# Patient Record
Sex: Female | Born: 1995 | Race: White | Hispanic: No | Marital: Single | Smoking: Former smoker
Health system: Southern US, Community
[De-identification: ages and names within clinical notes are randomized; demographics above are authoritative.]

## PROBLEM LIST (undated history)

## (undated) ENCOUNTER — Inpatient Hospital Stay (HOSPITAL_COMMUNITY): Payer: Self-pay

## (undated) DIAGNOSIS — Z789 Other specified health status: Secondary | ICD-10-CM

## (undated) HISTORY — PX: NO PAST SURGERIES: SHX2092

## (undated) HISTORY — PX: EYE SURGERY: SHX253

## (undated) HISTORY — PX: TONSILLECTOMY: SUR1361

---
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
## 2014-03-10 ENCOUNTER — Telehealth: Payer: Self-pay | Admitting: Pediatrics

## 2014-03-10 DIAGNOSIS — Z2089 Contact with and (suspected) exposure to other communicable diseases: Secondary | ICD-10-CM

## 2014-03-10 DIAGNOSIS — Z207 Contact with and (suspected) exposure to pediculosis, acariasis and other infestations: Secondary | ICD-10-CM

## 2014-03-10 MED ORDER — PERMETHRIN 5 % EX CREA: 1.0000 "application " | TOPICAL_CREAM | Freq: Once | CUTANEOUS | Status: DC

## 2014-03-10 NOTE — Telephone Encounter (Signed)
Household contact was seen in clinic today with scabies.  Entire household is being treated.

## 2014-10-15 ENCOUNTER — Encounter: Payer: Self-pay | Admitting: Urgent Care

## 2014-10-15 DIAGNOSIS — Z3202 Encounter for pregnancy test, result negative: Secondary | ICD-10-CM | POA: Insufficient documentation

## 2014-10-15 DIAGNOSIS — R630 Anorexia: Secondary | ICD-10-CM | POA: Insufficient documentation

## 2014-10-15 DIAGNOSIS — N939 Abnormal uterine and vaginal bleeding, unspecified: Secondary | ICD-10-CM | POA: Diagnosis not present

## 2014-10-15 DIAGNOSIS — N898 Other specified noninflammatory disorders of vagina: Secondary | ICD-10-CM | POA: Diagnosis present

## 2014-10-15 DIAGNOSIS — Z79899 Other long term (current) drug therapy: Secondary | ICD-10-CM | POA: Insufficient documentation

## 2014-10-15 DIAGNOSIS — Z791 Long term (current) use of non-steroidal anti-inflammatories (NSAID): Secondary | ICD-10-CM | POA: Insufficient documentation

## 2014-10-15 DIAGNOSIS — Z87891 Personal history of nicotine dependence: Secondary | ICD-10-CM | POA: Insufficient documentation

## 2014-10-15 DIAGNOSIS — R112 Nausea with vomiting, unspecified: Secondary | ICD-10-CM | POA: Insufficient documentation

## 2014-10-15 LAB — URINALYSIS COMPLETE WITH MICROSCOPIC (ARMC ONLY)
Bacteria, UA: NONE SEEN
Bilirubin Urine: NEGATIVE
Glucose, UA: NEGATIVE mg/dL
Ketones, ur: NEGATIVE mg/dL
Nitrite: NEGATIVE
Protein, ur: NEGATIVE mg/dL
Specific Gravity, Urine: 1.021 (ref 1.005–1.030)
pH: 6 (ref 5.0–8.0)

## 2014-10-15 LAB — POCT PREGNANCY, URINE: PREG TEST UR: NEGATIVE

## 2014-10-15 NOTE — ED Notes (Signed)
Patient presents with multiple c/o. Patient with decreased appetite, nausea, and dark vaginal discharge x 2 weeks. Symptoms began when she changed from Depo injections to oral BCPs.

## 2014-10-16 ENCOUNTER — Emergency Department
Admission: EM | Admit: 2014-10-16 | Discharge: 2014-10-16 | Disposition: A | Discharge status: Home / Self Care | Payer: Medicaid Other | Attending: Emergency Medicine | Admitting: Emergency Medicine

## 2014-10-16 DIAGNOSIS — N939 Abnormal uterine and vaginal bleeding, unspecified: Secondary | ICD-10-CM

## 2014-10-16 LAB — WET PREP, GENITAL
CLUE CELLS WET PREP: NONE SEEN
TRICH WET PREP: NONE SEEN
Yeast Wet Prep HPF POC: NONE SEEN

## 2014-10-16 LAB — CHLAMYDIA/NGC RT PCR (ARMC ONLY)
Chlamydia Tr: NOT DETECTED
N gonorrhoeae: NOT DETECTED

## 2014-10-16 NOTE — Discharge Instructions (Signed)
Please seek medical attention for any high fevers, chest pain, shortness of breath, change in behavior, persistent vomiting, bloody stool or any other new or concerning symptoms. ° ° °Abnormal Uterine Bleeding °Abnormal uterine bleeding can affect women at various stages in life, including teenagers, women in their reproductive years, pregnant women, and women who have reached menopause. Several kinds of uterine bleeding are considered abnormal, including: °· Bleeding or spotting between periods.   °· Bleeding after sexual intercourse.   °· Bleeding that is heavier or more than normal.   °· Periods that last longer than usual. °· Bleeding after menopause.   °Many cases of abnormal uterine bleeding are minor and simple to treat, while others are more serious. Any type of abnormal bleeding should be evaluated by your health care provider. Treatment will depend on the cause of the bleeding. °HOME CARE INSTRUCTIONS °Monitor your condition for any changes. The following actions may help to alleviate any discomfort you are experiencing: °· Avoid the use of tampons and douches as directed by your health care provider. °· Change your pads frequently. °You should get regular pelvic exams and Pap tests. Keep all follow-up appointments for diagnostic tests as directed by your health care provider.  °SEEK MEDICAL CARE IF:  °· Your bleeding lasts more than 1 week.   °· You feel dizzy at times.   °SEEK IMMEDIATE MEDICAL CARE IF:  °· You pass out.   °· You are changing pads every 15 to 30 minutes.   °· You have abdominal pain. °· You have a fever.   °· You become sweaty or weak.   °· You are passing large blood clots from the vagina.   °· You start to feel nauseous and vomit. °MAKE SURE YOU:  °· Understand these instructions. °· Will watch your condition. °· Will get help right away if you are not doing well or get worse. °  °This information is not intended to replace advice given to you by your health care provider. Make sure  you discuss any questions you have with your health care provider. °  °Document Released: 12/26/2004 Document Revised: 12/31/2012 Document Reviewed: 07/25/2012 °Elsevier Interactive Patient Education ©2016 [employer] ° °

## 2014-10-16 NOTE — ED Provider Notes (Signed)
[facility] Emergency Department Provider Note    ____________________________________________  Time seen: 0125  I have reviewed the triage vital signs and the nursing notes.   HISTORY  Chief Complaint Vaginal Discharge; Nausea; and Anorexia   History limited by: Not Limited   HPI Kelly Burch is a 19 y.o. female who presents to the emergency department today with primary concern for vaginal bleeding. The patient states that she has had 2 weeks now of abnormal vaginal bleeding. She states it is dark red. She has had someassociated pelvic pain. She describes it as crampy. It has been intermittent. It is located in the midline. She denies any bad odor to the discharge. Denies any vaginal discomfort. She states she has now been on oral birth control pills for the past 2 months. In addition the patient has had some associated nausea and decreased appetite. She denies any chest pain or shortness of breath.     Past Medical History  Diagnosis Date  . Medical history non-contributory     Patient Active Problem List   Diagnosis Date Noted  . Pregnancy 03/05/2013  . Polyhydramnios in third trimester, antepartum complication 03/04/2013  . GBS (group B Streptococcus carrier), +RV culture, currently pregnant 02/12/2013  . Traumatic injury during pregnancy in second trimester 11/27/2012  . Supervision of normal first teen pregnancy in second trimester 11/06/2012    Past Surgical History  Procedure Laterality Date  . No past surgeries    . Tonsillectomy    . Eye surgery      Current Outpatient Rx  Name  Route  Sig  Dispense  Refill  . ibuprofen (ADVIL,MOTRIN) 600 MG tablet   Oral   Take 1 tablet (600 mg total) by mouth every 6 (six) hours.   30 tablet   0   . levonorgestrel (MIRENA) 20 MCG/24HR IUD   Intrauterine   1 Intra Uterine Device (1 each total) by Intrauterine route once.   1 each   0   . norethindrone-ethinyl estradiol  (TRIPHASIL,CYCLAFEM,ALYACEN) 0.5/0.75/1-35 MG-MCG tablet   Oral   Take 1 tablet by mouth daily.   1 Package   0   . permethrin (ACTICIN) 5 % cream   Topical   Apply 1 application topically once.   60 g   0   . Prenatal Vit-Fe Fumarate-FA (PRENATAL MULTIVITAMIN) TABS tablet   Oral   Take 1 tablet by mouth at bedtime.         . sertraline (ZOLOFT) 50 MG tablet   Oral   Take 1 tablet (50 mg total) by mouth daily.   90 tablet   1     Allergies Review of patient's allergies indicates no known allergies.  Family History  Problem Relation Age of Onset  . Alcohol abuse Neg Hx   . Arthritis Neg Hx   . Birth defects Neg Hx   . COPD Neg Hx   . Depression Neg Hx   . Early death Neg Hx   . Hearing loss Neg Hx   . Heart disease Neg Hx   . Hyperlipidemia Neg Hx   . Kidney disease Neg Hx   . Learning disabilities Neg Hx   . Mental illness Neg Hx   . Mental retardation Neg Hx   . Miscarriages / Stillbirths Neg Hx   . Stroke Neg Hx   . Vision loss Neg Hx   . Asthma Mother   . Hypertension Father   . Cancer Maternal Grandfather   . Diabetes  Maternal Grandfather   . Cancer Paternal Grandfather     Social History Social History  Substance Use Topics  . Smoking status: Former Smoker -- 0.50 packs/day for 2 years    Types: Cigarettes  . Smokeless tobacco: Never Used  . Alcohol Use: No    Review of Systems  Constitutional: Negative for fever. Cardiovascular: Negative for chest pain. Respiratory: Negative for shortness of breath. Gastrointestinal: Negative for abdominal pain, vomiting and diarrhea. Genitourinary: Negative for dysuria. Musculoskeletal: Negative for back pain. Skin: Negative for rash. Neurological: Negative for headaches, focal weakness or numbness.   10-point ROS otherwise negative.  ____________________________________________   PHYSICAL EXAM:  VITAL SIGNS: ED Triage Vitals  Enc Vitals Group     BP 10/15/14 2234 131/66 mmHg     Pulse Rate  10/15/14 2234 91     Resp 10/15/14 2234 16     Temp 10/15/14 2234 98.5 F (36.9 C)     Temp Source 10/15/14 2234 Oral     SpO2 10/15/14 2234 100 %     Weight --      Height --      Head Cir --      Peak Flow --      Pain Score 10/15/14 2234 2   Constitutional: Alert and oriented. Well appearing and in no distress. Eyes: Conjunctivae are normal. PERRL. Normal extraocular movements. ENT   Head: Normocephalic and atraumatic.   Nose: No congestion/rhinnorhea.   Mouth/Throat: Mucous membranes are moist.   Neck: No stridor. Hematological/Lymphatic/Immunilogical: No cervical lymphadenopathy. Cardiovascular: Normal rate, regular rhythm.  No murmurs, rubs, or gallops. Respiratory: Normal respiratory effort without tachypnea nor retractions. Breath sounds are clear and equal bilaterally. No wheezes/rales/rhonchi. Gastrointestinal: Soft and nontender. No distention. There is no CVA tenderness. Genitourinary: No external lesions. Small amount of blood in the vaginal vault. No cervical motion tenderness. No adnexal tenderness or fullness. Musculoskeletal: Normal range of motion in all extremities. No joint effusions.  No lower extremity tenderness nor edema. Neurologic:  Normal speech and language. No gross focal neurologic deficits are appreciated. Speech is normal.  Skin:  Skin is warm, dry and intact. No rash noted. Psychiatric: Mood and affect are normal. Speech and behavior are normal. Patient exhibits appropriate insight and judgment.  ____________________________________________    LABS (pertinent positives/negatives)  Labs Reviewed  URINALYSIS COMPLETEWITH MICROSCOPIC ([facility] ONLY) - Abnormal; Notable for the following:    Color, Urine YELLOW (*)    APPearance HAZY (*)    Hgb urine dipstick 3+ (*)    Leukocytes, UA TRACE (*)    Squamous Epithelial / LPF 6-30 (*)    All other components within normal limits  POCT PREGNANCY, URINE      ____________________________________________   EKG  None  ____________________________________________    RADIOLOGY  None  ___________________________________________   PROCEDURES  Procedure(s) performed: None  Critical Care performed: No  ____________________________________________   INITIAL IMPRESSION / ASSESSMENT AND PLAN / ED COURSE  Pertinent labs & imaging results that were available during my care of the patient were reviewed by me and considered in my medical decision making (see chart for details).  Patient presented to the emergency department today with concerns for abnormal vaginal bleeding. At this point I think likely patient's bleeding related to the oral contraceptives. Vital signs stable. Will have patient follow-up with OB/GYN.  ____________________________________________   FINAL CLINICAL IMPRESSION(S) / ED DIAGNOSES  Final diagnoses:  Abnormal uterine bleeding     Phineas Semen, MD 10/16/14 [phone]

## 2014-12-07 ENCOUNTER — Emergency Department
Admission: EM | Admit: 2014-12-07 | Discharge: 2014-12-07 | Disposition: A | Discharge status: Home / Self Care | Payer: BLUE CROSS/BLUE SHIELD | Attending: Emergency Medicine | Admitting: Emergency Medicine

## 2014-12-07 ENCOUNTER — Encounter: Payer: Self-pay | Admitting: Urgent Care

## 2014-12-07 DIAGNOSIS — Z79899 Other long term (current) drug therapy: Secondary | ICD-10-CM | POA: Diagnosis not present

## 2014-12-07 DIAGNOSIS — N898 Other specified noninflammatory disorders of vagina: Secondary | ICD-10-CM | POA: Diagnosis not present

## 2014-12-07 DIAGNOSIS — R1030 Lower abdominal pain, unspecified: Secondary | ICD-10-CM | POA: Diagnosis present

## 2014-12-07 DIAGNOSIS — Z791 Long term (current) use of non-steroidal anti-inflammatories (NSAID): Secondary | ICD-10-CM | POA: Insufficient documentation

## 2014-12-07 DIAGNOSIS — Z87891 Personal history of nicotine dependence: Secondary | ICD-10-CM | POA: Diagnosis not present

## 2014-12-07 DIAGNOSIS — Z3202 Encounter for pregnancy test, result negative: Secondary | ICD-10-CM | POA: Diagnosis not present

## 2014-12-07 LAB — WET PREP, GENITAL
CLUE CELLS WET PREP: NONE SEEN
Sperm: NONE SEEN
Trich, Wet Prep: NONE SEEN
YEAST WET PREP: NONE SEEN

## 2014-12-07 LAB — URINALYSIS COMPLETE WITH MICROSCOPIC (ARMC ONLY)
BACTERIA UA: NONE SEEN
Bilirubin Urine: NEGATIVE
GLUCOSE, UA: NEGATIVE mg/dL
HGB URINE DIPSTICK: NEGATIVE
Ketones, ur: NEGATIVE mg/dL
Leukocytes, UA: NEGATIVE
Nitrite: NEGATIVE
Protein, ur: NEGATIVE mg/dL
Specific Gravity, Urine: 1.027 (ref 1.005–1.030)
pH: 6 (ref 5.0–8.0)

## 2014-12-07 LAB — CHLAMYDIA/NGC RT PCR (ARMC ONLY)
Chlamydia Tr: NOT DETECTED
N GONORRHOEAE: NOT DETECTED

## 2014-12-07 LAB — POCT PREGNANCY, URINE: PREG TEST UR: NEGATIVE

## 2014-12-07 NOTE — ED Notes (Signed)
Pt complains of white discharge with odor for two weeks. Pt states the pain is in lower abdomen.

## 2014-12-07 NOTE — ED Provider Notes (Signed)
[facility] Emergency Department Provider Note   ____________________________________________  Time seen: 2000  I have reviewed the triage vital signs and the nursing notes.   HISTORY  Chief Complaint Abdominal Cramping; Nausea; and Vaginal Discharge   History limited by: Not Limited   HPI Kelly Burch is a 19 y.o. female with no significant past medical history presents to the emergency department today because of concern for a 2 week history of abdominal pain and vaginal discharge. Patient states that her pain will start out in the suprapubic region as a cramping and then migrated down into her vaginal area. She states it then becomes a sharp pain. The patient states that this pain is intermittent. She states during the same 2 weeks. She has had her normal amount of vaginal white discharge however she has now noticed a foul odor to it. She denies similar symptoms in the past. Denies any recent fevers, nausea or vomiting. She denies any recent antibiotic use.     Past Medical History  Diagnosis Date  . Medical history non-contributory     Patient Active Problem List   Diagnosis Date Noted  . Pregnancy 03/05/2013  . Polyhydramnios in third trimester, antepartum complication 03/04/2013  . GBS (group B Streptococcus carrier), +RV culture, currently pregnant 02/12/2013  . Traumatic injury during pregnancy in second trimester 11/27/2012  . Supervision of normal first teen pregnancy in second trimester 11/06/2012    Past Surgical History  Procedure Laterality Date  . No past surgeries    . Tonsillectomy    . Eye surgery      Current Outpatient Rx  Name  Route  Sig  Dispense  Refill  . ibuprofen (ADVIL,MOTRIN) 600 MG tablet   Oral   Take 1 tablet (600 mg total) by mouth every 6 (six) hours.   30 tablet   0   . levonorgestrel (MIRENA) 20 MCG/24HR IUD   Intrauterine   1 Intra Uterine Device (1 each total) by Intrauterine route once.   1  each   0   . norethindrone-ethinyl estradiol (TRIPHASIL,CYCLAFEM,ALYACEN) 0.5/0.75/1-35 MG-MCG tablet   Oral   Take 1 tablet by mouth daily.   1 Package   0   . permethrin (ACTICIN) 5 % cream   Topical   Apply 1 application topically once.   60 g   0   . Prenatal Vit-Fe Fumarate-FA (PRENATAL MULTIVITAMIN) TABS tablet   Oral   Take 1 tablet by mouth at bedtime.         . sertraline (ZOLOFT) 50 MG tablet   Oral   Take 1 tablet (50 mg total) by mouth daily.   90 tablet   1     Allergies Review of patient's allergies indicates no known allergies.  Family History  Problem Relation Age of Onset  . Alcohol abuse Neg Hx   . Arthritis Neg Hx   . Birth defects Neg Hx   . COPD Neg Hx   . Depression Neg Hx   . Early death Neg Hx   . Hearing loss Neg Hx   . Heart disease Neg Hx   . Hyperlipidemia Neg Hx   . Kidney disease Neg Hx   . Learning disabilities Neg Hx   . Mental illness Neg Hx   . Mental retardation Neg Hx   . Miscarriages / Stillbirths Neg Hx   . Stroke Neg Hx   . Vision loss Neg Hx   . Asthma Mother   . Hypertension Father   .  Cancer Maternal Grandfather   . Diabetes Maternal Grandfather   . Cancer Paternal Grandfather     Social History Social History  Substance Use Topics  . Smoking status: Former Smoker -- 0.50 packs/day for 2 years    Types: Cigarettes  . Smokeless tobacco: Never Used  . Alcohol Use: No    Review of Systems  Constitutional: Negative for fever. Cardiovascular: Negative for chest pain. Respiratory: Negative for shortness of breath. Gastrointestinal: Positive for suprapubic abdominal pain Genitourinary: Negative for dysuria. Musculoskeletal: Negative for back pain. Skin: Negative for rash. Neurological: Negative for headaches, focal weakness or numbness.  10-point ROS otherwise negative.  ____________________________________________   PHYSICAL EXAM:  VITAL SIGNS: ED Triage Vitals  Enc Vitals Group     BP 12/07/14  1936 126/76 mmHg     Pulse Rate 12/07/14 1936 95     Resp 12/07/14 1936 14     Temp 12/07/14 1936 98.4 F (36.9 C)     Temp Source 12/07/14 1936 Oral     SpO2 12/07/14 1936 100 %     Weight 12/07/14 1936 135 lb (61.236 kg)     Height 12/07/14 1936 5\' 3"  (1.6 m)     Head Cir --      Peak Flow --      Pain Score 12/07/14 1936 4   Constitutional: Alert and oriented. Well appearing and in no distress. Eyes: Conjunctivae are normal. PERRL. Normal extraocular movements. ENT   Head: Normocephalic and atraumatic.   Nose: No congestion/rhinnorhea.   Mouth/Throat: Mucous membranes are moist.   Neck: No stridor. Hematological/Lymphatic/Immunilogical: No cervical lymphadenopathy. Cardiovascular: Normal rate, regular rhythm.  No murmurs, rubs, or gallops. Respiratory: Normal respiratory effort without tachypnea nor retractions. Breath sounds are clear and equal bilaterally. No wheezes/rales/rhonchi. Gastrointestinal: Soft and nontender. No distention. There is no CVA tenderness. Genitourinary: No external lesions. No blood in vaginal vault. Small amount of vaginal discharge. No CMT. No adnexal fullness or tenderness.  Musculoskeletal: Normal range of motion in all extremities. No joint effusions.  No lower extremity tenderness nor edema. Neurologic:  Normal speech and language. No gross focal neurologic deficits are appreciated.  Skin:  Skin is warm, dry and intact. No rash noted. Psychiatric: Mood and affect are normal. Speech and behavior are normal. Patient exhibits appropriate insight and judgment.  ____________________________________________    LABS (pertinent positives/negatives)  Labs Reviewed  WET PREP, GENITAL - Abnormal; Notable for the following:    WBC, Wet Prep HPF POC FEW (*)    All other components within normal limits  URINALYSIS COMPLETEWITH MICROSCOPIC ([facility] ONLY) - Abnormal; Notable for the following:    Color, Urine YELLOW (*)    APPearance HAZY (*)     Squamous Epithelial / LPF 6-30 (*)    All other components within normal limits  CHLAMYDIA/NGC RT PCR ([facility] ONLY)  POCT PREGNANCY, URINE     ____________________________________________   EKG  None  ____________________________________________    RADIOLOGY  None   ____________________________________________   PROCEDURES  Procedure(s) performed: None  Critical Care performed: No  ____________________________________________   INITIAL IMPRESSION / ASSESSMENT AND PLAN / ED COURSE  Pertinent labs & imaging results that were available during my care of the patient were reviewed by me and considered in my medical decision making (see chart for details).  Patient presented to the emergency department today with concerns for change in vaginal discharge. As well as suprapubic pain. Wet prep and urine without concerning findings. This point unclear etiology of the  vaginitis. Encourage patient to have primary care follow-up.  ____________________________________________   FINAL CLINICAL IMPRESSION(S) / ED DIAGNOSES  Final diagnoses:  Vaginal discharge     Phineas Semen, MD 12/07/14 2041

## 2014-12-07 NOTE — ED Notes (Signed)
Patient presents with c/o suprapubic pain that "radiates down into vagina" and (+) nausea x 2 weeks. Denies dysuria. Patient reports a "thick white" malodorous discharge. Patient reports that she works 8-5 and has been unable to get to PCP. [facility], however they refused her insurance.

## 2014-12-07 NOTE — Discharge Instructions (Signed)
Please seek medical attention for any high fevers, chest pain, shortness of breath, change in behavior, persistent vomiting, bloody stool or any other new or concerning symptoms.  Vaginitis Vaginitis is an inflammation of the vagina. It is most often caused by a change in the normal balance of the bacteria and yeast that live in the vagina. This change in balance causes an overgrowth of certain bacteria or yeast, which causes the inflammation. There are different types of vaginitis, but the most common types are:  Bacterial vaginosis.  Yeast infection (candidiasis).  Trichomoniasis vaginitis. This is a sexually transmitted infection (STI).  Viral vaginitis.  Atrophic vaginitis.  Allergic vaginitis. CAUSES  The cause depends on the type of vaginitis. Vaginitis can be caused by:  Bacteria (bacterial vaginosis).  Yeast (yeast infection).  A parasite (trichomoniasis vaginitis)  A virus (viral vaginitis).  Low hormone levels (atrophic vaginitis). Low hormone levels can occur during pregnancy, breastfeeding, or after menopause.  Irritants, such as bubble baths, scented tampons, and feminine sprays (allergic vaginitis). Other factors can change the normal balance of the yeast and bacteria that live in the vagina. These include:  Antibiotic medicines.  Poor hygiene.  Diaphragms, vaginal sponges, spermicides, birth control pills, and intrauterine devices (IUD).  Sexual intercourse.  Infection.  Uncontrolled diabetes.  A weakened immune system. SYMPTOMS  Symptoms can vary depending on the cause of the vaginitis. Common symptoms include:  Abnormal vaginal discharge.  The discharge is white, gray, or yellow with bacterial vaginosis.  The discharge is thick, white, and cheesy with a yeast infection.  The discharge is frothy and yellow or greenish with trichomoniasis.  A bad vaginal odor.  The odor is fishy with bacterial vaginosis.  Vaginal itching, pain, or  swelling.  Painful intercourse.  Pain or burning when urinating. Sometimes, there are no symptoms. TREATMENT  Treatment will vary depending on the type of infection.   Bacterial vaginosis and trichomoniasis are often treated with antibiotic creams or pills.  Yeast infections are often treated with antifungal medicines, such as vaginal creams or suppositories.  Viral vaginitis has no cure, but symptoms can be treated with medicines that relieve discomfort. Your sexual partner should be treated as well.  Atrophic vaginitis may be treated with an estrogen cream, pill, suppository, or vaginal ring. If vaginal dryness occurs, lubricants and moisturizing creams may help. You may be told to avoid scented soaps, sprays, or douches.  Allergic vaginitis treatment involves quitting the use of the product that is causing the problem. Vaginal creams can be used to treat the symptoms. HOME CARE INSTRUCTIONS   Take all medicines as directed by your caregiver.  Keep your genital area clean and dry. Avoid soap and only rinse the area with water.  Avoid douching. It can remove the healthy bacteria in the vagina.  Do not use tampons or have sexual intercourse until your vaginitis has been treated. Use sanitary pads while you have vaginitis.  Wipe from front to back. This avoids the spread of bacteria from the rectum to the vagina.  Let air reach your genital area.  Wear cotton underwear to decrease moisture buildup.  Avoid wearing underwear while you sleep until your vaginitis is gone.  Avoid tight pants and underwear or nylons without a cotton panel.  Take off wet clothing (especially bathing suits) as soon as possible.  Use mild, non-scented products. Avoid using irritants, such as:  Scented feminine sprays.  Fabric softeners.  Scented detergents.  Scented tampons.  Scented soaps or bubble baths.  Practice safe sex and use condoms. Condoms may prevent the spread of trichomoniasis  and viral vaginitis. SEEK MEDICAL CARE IF:   You have abdominal pain.  You have a fever or persistent symptoms for more than 2-3 days.  You have a fever and your symptoms suddenly get worse.   This information is not intended to replace advice given to you by your health care provider. Make sure you discuss any questions you have with your health care provider.   Document Released: 10/23/2006 Document Revised: 05/12/2014 Document Reviewed: 06/08/2011 Elsevier Interactive Patient Education [employer]

## 2015-03-20 IMAGING — US US OB FOLLOW-UP
2 series · 12 of 28 positions shown · non-contrast
Comparison: none

[Series 1: us ob follow up · 11 of 67 slices shown (1 of 2)]
[im 3/67]
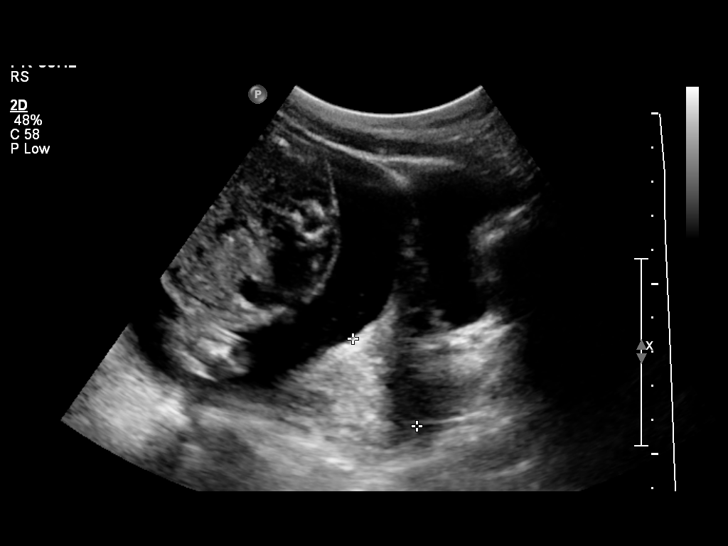
[im 9/67]
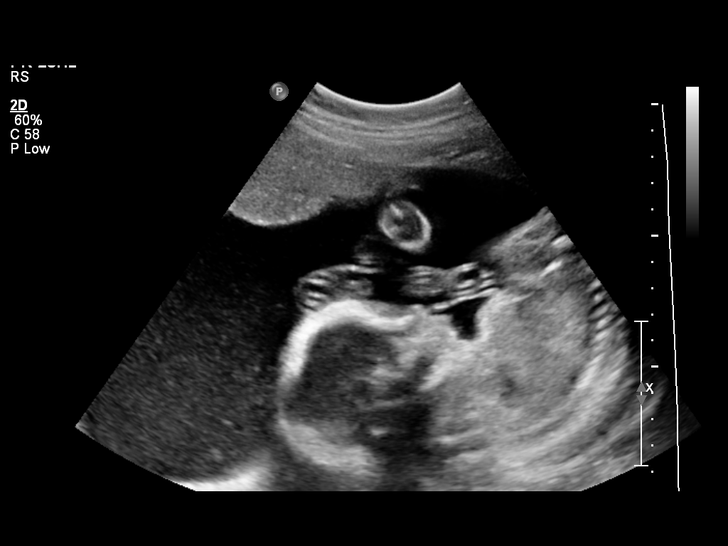
[im 14/67]
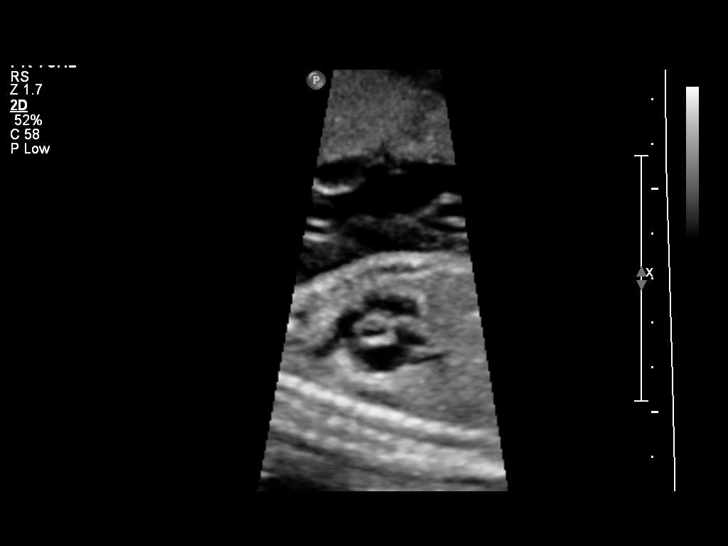
[im 23/67]
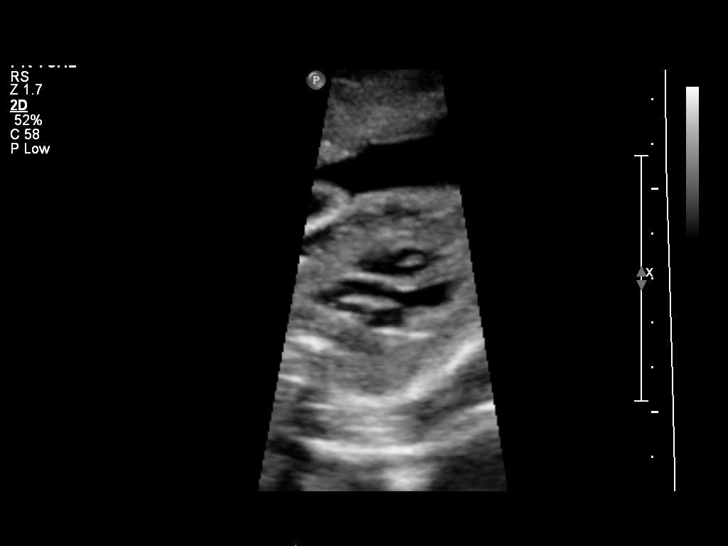
[im 28/67]
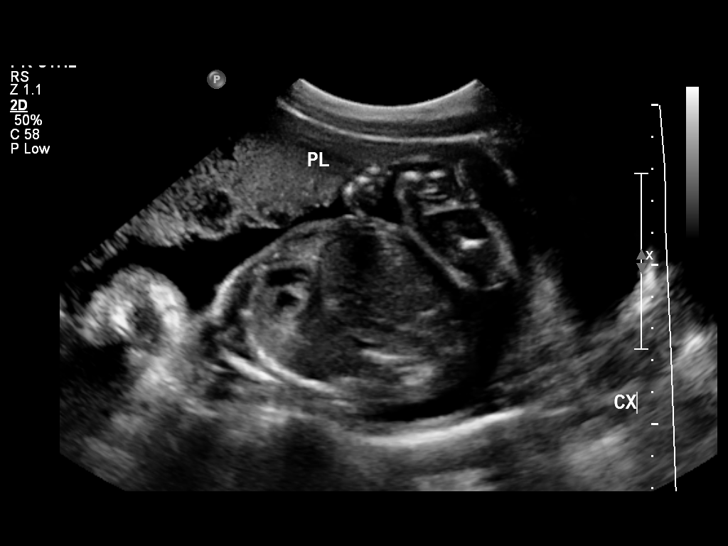
[im 34/67]
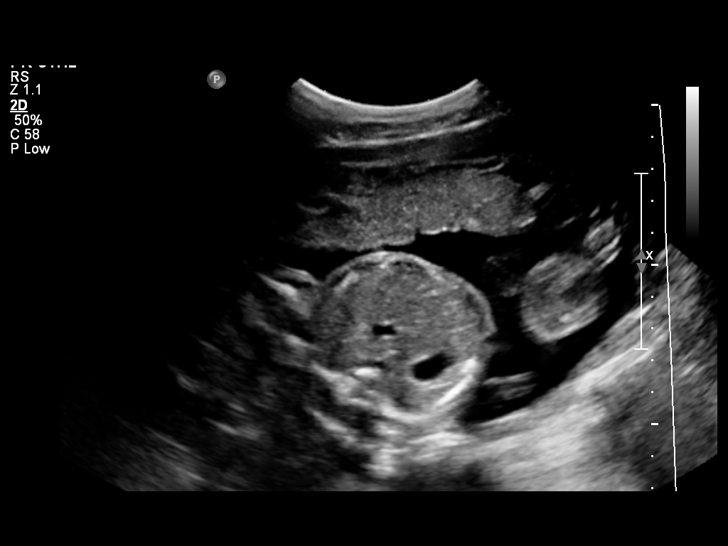
[im 42/67]
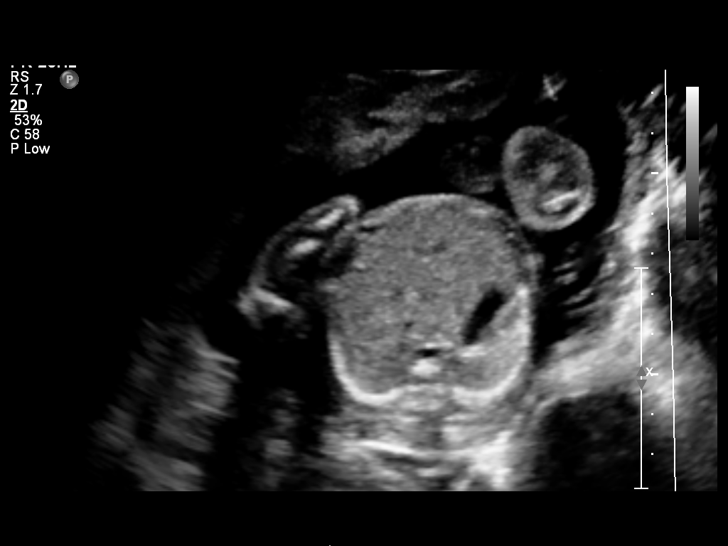
[im 47/67]
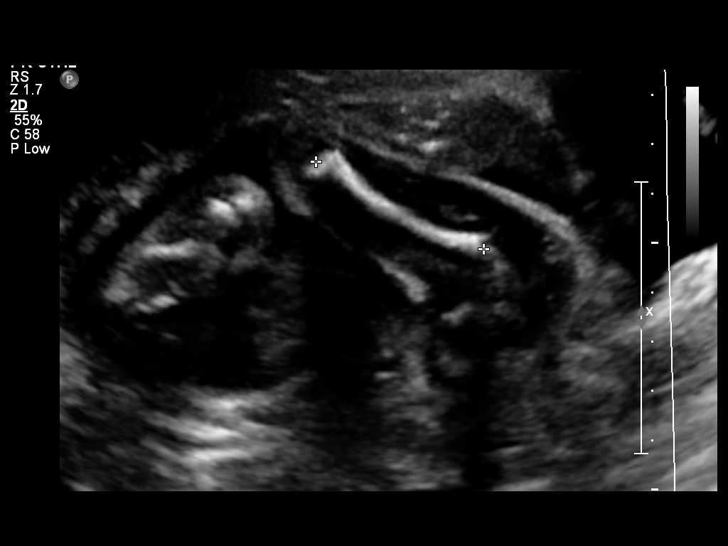
[im 53/67]
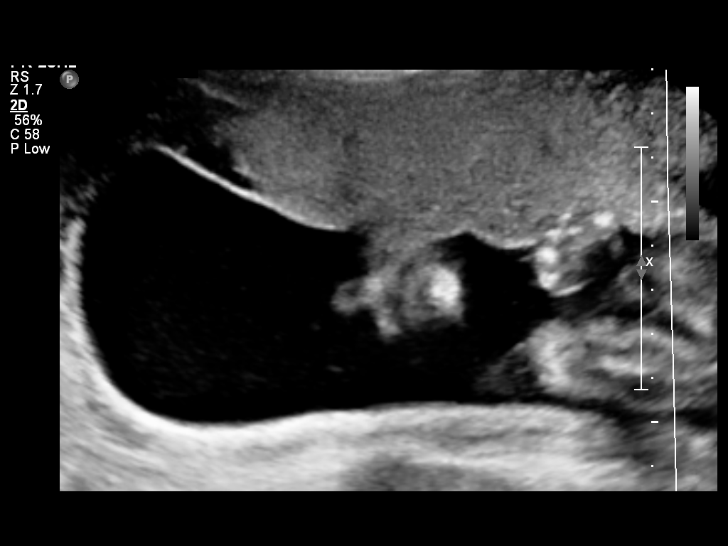
[im 61/67]
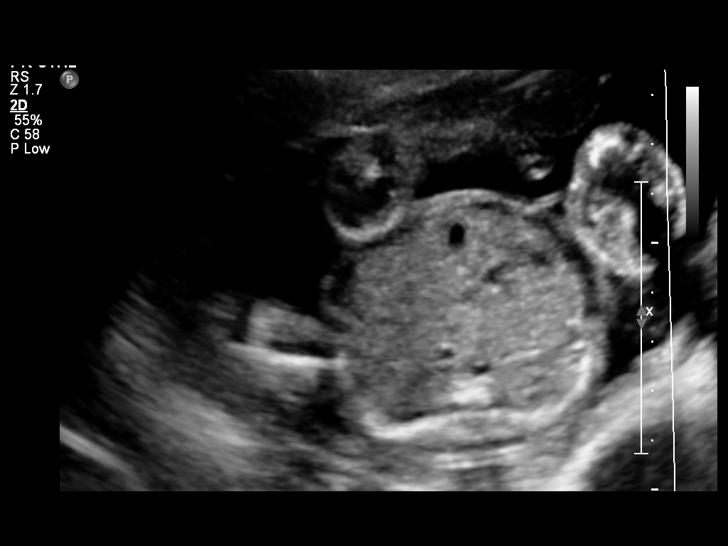
[im 67/67]
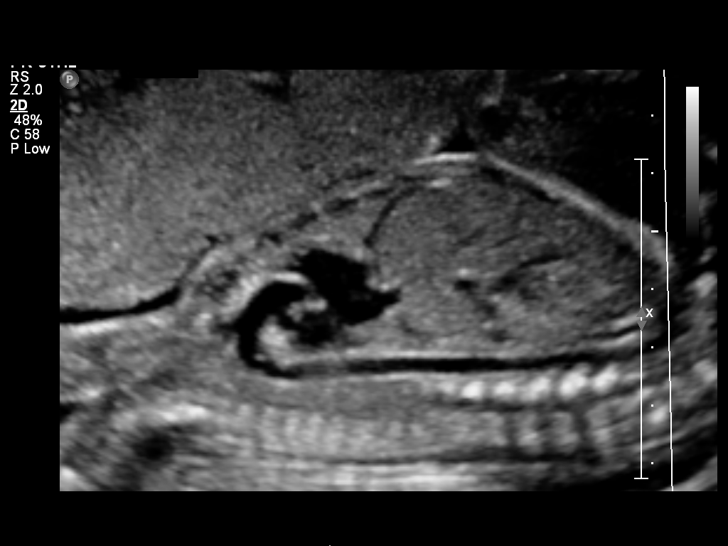

[Series 1: us ob follow up · 1 of 7 slices shown (2 of 2)]
[im 4/7]
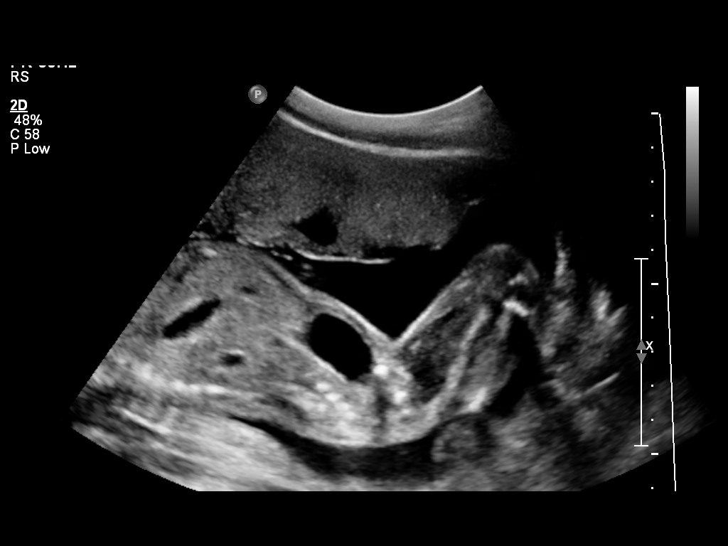

[12 of 28 positions shown; findings below may reference images not displayed]

OBSTETRICS REPORT
                      (Signed Final 11/06/2012 [DATE])

Service(s) Provided

 US OB FOLLOW UP                                       76816.1
Indications

 Follow-up incomplete fetal anatomic evaluation
 No or Little Prenatal Care
Fetal Evaluation

 Num Of Fetuses:    1
 Fetal Heart Rate:  134                          bpm
 Cardiac Activity:  Observed
 Presentation:      Transverse, head to
                    maternal right
 Placenta:          Anterior, above cervical os
 P. Cord            Visualized, central
 Insertion:

 Amniotic Fluid
 AFI FV:      Subjectively within normal limits
                                             Larg Pckt:    5.68  cm
Biometry

 BPD:     55.4  mm     G. Age:  22w 6d                CI:        71.87   70 - 86
                                                      FL/HC:      18.5   18.7 -

 HC:       208  mm     G. Age:  22w 6d       10  %    HC/AC:      1.16   1.05 -

 AC:     179.1  mm     G. Age:  22w 6d       17  %    FL/BPD:     69.3   71 - 87
 FL:      38.4  mm     G. Age:  22w 2d        7  %    FL/AC:      21.4   20 - 24

 Est. FW:     519  gm      1 lb 2 oz     27  %
Gestational Age

 Clinical EDD:  23w 5d                                        EDD:   02/28/13
 U/S Today:     22w 5d                                        EDD:   03/07/13
 Best:          23w 5d     Det. By:  Clinical EDD             EDD:   02/28/13
Anatomy

 Cranium:          Appears normal         Aortic Arch:      Appears normal
 Fetal Cavum:      Appears normal         Ductal Arch:      Appears normal
 Ventricles:       Appears normal         Diaphragm:        Appears normal
 Choroid Plexus:   Previously seen        Stomach:          Appears normal, left
                                                            sided
 Cerebellum:       Previously seen        Abdomen:          Appears normal
 Posterior Fossa:  Previously seen        Abdominal Wall:   Appears nml (cord
                                                            insert, abd wall)
 Nuchal Fold:      Previously seen        Cord Vessels:     Appears normal (3
                                                            vessel cord)
 Face:             Appears normal         Kidneys:          Appear normal
                   (orbits and profile)
 Lips:             Appears normal         Bladder:          Appears normal
 Heart:            Appears normal         Spine:            Not well visualized
                   (4CH, axis, and
                   situs)
 RVOT:             Appears normal         Lower             Appears normal
                                          Extremities:
 LVOT:             Previously seen        Upper             Appears normal
                                          Extremities:

 Other:  Fetus appears to be a male. Technically difficult due to fetal position.
         Heels visualized.
Cervix Uterus Adnexa

 Cervical Length:    3.2      cm

 Cervix:       Normal appearance by transabdominal scan.
 Uterus:       No abnormality visualized.
 Cul De Sac:   No free fluid seen.

 Left Ovary:    Not visualized.
 Right Ovary:   Not visualized.
 Adnexa:     No abnormality visualized.
Impression

 Single IUP at 23 [DATE] weeks
 Normal interval anatomy; the anatomic fetal survey is now
 complete
 Interval growth is appropriate (22nd %tile)
 Normal amniotic fluid volume
Recommendations

 Follow-up ultrasounds as clinically indicated.

 questions or concerns.

## 2015-04-06 ENCOUNTER — Emergency Department
Admission: EM | Admit: 2015-04-06 | Discharge: 2015-04-06 | Disposition: A | Discharge status: Home / Self Care | Payer: BLUE CROSS/BLUE SHIELD | Attending: Emergency Medicine | Admitting: Emergency Medicine

## 2015-04-06 ENCOUNTER — Encounter: Payer: Self-pay | Admitting: Medical Oncology

## 2015-04-06 DIAGNOSIS — Z79899 Other long term (current) drug therapy: Secondary | ICD-10-CM | POA: Diagnosis not present

## 2015-04-06 DIAGNOSIS — J069 Acute upper respiratory infection, unspecified: Secondary | ICD-10-CM | POA: Insufficient documentation

## 2015-04-06 DIAGNOSIS — Z793 Long term (current) use of hormonal contraceptives: Secondary | ICD-10-CM | POA: Diagnosis not present

## 2015-04-06 DIAGNOSIS — J029 Acute pharyngitis, unspecified: Secondary | ICD-10-CM | POA: Diagnosis present

## 2015-04-06 MED ORDER — GUAIFENESIN-CODEINE 100-10 MG/5ML PO SOLN: 10.0000 mL | ORAL | Status: DC | PRN

## 2015-04-06 MED ORDER — IBUPROFEN 800 MG PO TABS: 800.0000 mg | ORAL_TABLET | Freq: Three times a day (TID) | ORAL | Status: DC | PRN

## 2015-04-06 MED ORDER — CHLORPHENIRAMINE MALEATE 4 MG PO TABS: 4.0000 mg | ORAL_TABLET | Freq: Two times a day (BID) | ORAL | Status: DC | PRN

## 2015-04-06 NOTE — Discharge Instructions (Signed)
Upper Respiratory Infection, Adult Most upper respiratory infections (URIs) are a viral infection of the air passages leading to the lungs. A URI affects the nose, throat, and upper air passages. The most common type of URI is nasopharyngitis and is typically referred to as "the common cold." URIs run their course and usually go away on their own. Most of the time, a URI does not require medical attention, but sometimes a bacterial infection in the upper airways can follow a viral infection. This is called a secondary infection. Sinus and middle ear infections are common types of secondary upper respiratory infections. Bacterial pneumonia can also complicate a URI. A URI can worsen asthma and chronic obstructive pulmonary disease (COPD). Sometimes, these complications can require emergency medical care and may be life threatening.  CAUSES Almost all URIs are caused by viruses. A virus is a type of germ and can spread from one person to another.  RISKS FACTORS You may be at risk for a URI if:   You smoke.   You have chronic heart or lung disease.  You have a weakened defense (immune) system.   You are very young or very old.   You have nasal allergies or asthma.  You work in crowded or poorly ventilated areas.  You work in health care facilities or schools. SIGNS AND SYMPTOMS  Symptoms typically develop 2-3 days after you come in contact with a cold virus. Most viral URIs last 7-10 days. However, viral URIs from the influenza virus (flu virus) can last 14-18 days and are typically more severe. Symptoms may include:   Runny or stuffy (congested) nose.   Sneezing.   Cough.   Sore throat.   Headache.   Fatigue.   Fever.   Loss of appetite.   Pain in your forehead, behind your eyes, and over your cheekbones (sinus pain).  Muscle aches.  DIAGNOSIS  Your health care provider may diagnose a URI by:  Physical exam.  Tests to check that your symptoms are not due to  another condition such as:  Strep throat.  Sinusitis.  Pneumonia.  Asthma. TREATMENT  A URI goes away on its own with time. It cannot be cured with medicines, but medicines may be prescribed or recommended to relieve symptoms. Medicines may help:  Reduce your fever.  Reduce your cough.  Relieve nasal congestion. HOME CARE INSTRUCTIONS   Take medicines only as directed by your health care provider.   Gargle warm saltwater or take cough drops to comfort your throat as directed by your health care provider.  Use a warm mist humidifier or inhale steam from a shower to increase air moisture. This may make it easier to breathe.  Drink enough fluid to keep your urine clear or pale yellow.   Eat soups and other clear broths and maintain good nutrition.   Rest as needed.   Return to work when your temperature has returned to normal or as your health care provider advises. You may need to stay home longer to avoid infecting others. You can also use a face mask and careful hand washing to prevent spread of the virus.  Increase the usage of your inhaler if you have asthma.   Do not use any tobacco products, including cigarettes, chewing tobacco, or electronic cigarettes. If you need help quitting, ask your health care provider. PREVENTION  The best way to protect yourself from getting a cold is to practice good hygiene.   Avoid oral or hand contact with people with cold   symptoms.   Wash your hands often if contact occurs.  There is no clear evidence that vitamin C, vitamin E, echinacea, or exercise reduces the chance of developing a cold. However, it is always recommended to get plenty of rest, exercise, and practice good nutrition.  SEEK MEDICAL CARE IF:   You are getting worse rather than better.   Your symptoms are not controlled by medicine.   You have chills.  You have worsening shortness of breath.  You have brown or red mucus.  You have yellow or brown nasal  discharge.  You have pain in your face, especially when you bend forward.  You have a fever.  You have swollen neck glands.  You have pain while swallowing.  You have white areas in the back of your throat. SEEK IMMEDIATE MEDICAL CARE IF:   You have severe or persistent:  Headache.  Ear pain.  Sinus pain.  Chest pain.  You have chronic lung disease and any of the following:  Wheezing.  Prolonged cough.  Coughing up blood.  A change in your usual mucus.  You have a stiff neck.  You have changes in your:  Vision.  Hearing.  Thinking.  Mood. MAKE SURE YOU:   Understand these instructions.  Will watch your condition.  Will get help right away if you are not doing well or get worse.   This information is not intended to replace advice given to you by your health care provider. Make sure you discuss any questions you have with your health care provider.   Document Released: 06/21/2000 Document Revised: 05/12/2014 Document Reviewed: 04/02/2013 Elsevier Interactive Patient Education 2016 [employer]  

## 2015-04-06 NOTE — ED Notes (Signed)
Pt reports she was seen at urgent care yesterday and diagnosed with strep. Pt reports she isnt feeling better despite starting abx.

## 2015-04-06 NOTE — ED Provider Notes (Signed)
[facility] Emergency Department Provider Note  ____________________________________________  Time seen: Approximately 10:11 AM  I have reviewed the triage vital signs and the nursing notes.   HISTORY  Chief Complaint Sore Throat    HPI Kelly Burch is a 20 y.o. female resents for evaluation of continued feeling bad after diagnosed with strep [facility]. States that she's got continued fever body aches and coughing.   Past Medical History  Diagnosis Date  . Medical history non-contributory     Patient Active Problem List   Diagnosis Date Noted  . Pregnancy 03/05/2013  . Polyhydramnios in third trimester, antepartum complication 03/04/2013  . GBS (group B Streptococcus carrier), +RV culture, currently pregnant 02/12/2013  . Traumatic injury during pregnancy in second trimester 11/27/2012  . Supervision of normal first teen pregnancy in second trimester 11/06/2012    Past Surgical History  Procedure Laterality Date  . No past surgeries    . Tonsillectomy    . Eye surgery      Current Outpatient Rx  Name  Route  Sig  Dispense  Refill  . chlorpheniramine (CHLOR-TRIMETON) 4 MG tablet   Oral   Take 1 tablet (4 mg total) by mouth 2 (two) times daily as needed for allergies or rhinitis.   30 tablet   0   . guaiFENesin-codeine 100-10 MG/5ML syrup   Oral   Take 10 mLs by mouth every 4 (four) hours as needed for cough.   180 mL   0   . ibuprofen (ADVIL,MOTRIN) 800 MG tablet   Oral   Take 1 tablet (800 mg total) by mouth every 8 (eight) hours as needed.   30 tablet   0   . levonorgestrel (MIRENA) 20 MCG/24HR IUD   Intrauterine   1 Intra Uterine Device (1 each total) by Intrauterine route once.   1 each   0   . norethindrone-ethinyl estradiol (TRIPHASIL,CYCLAFEM,ALYACEN) 0.5/0.75/1-35 MG-MCG tablet   Oral   Take 1 tablet by mouth daily.   1 Package   0   . permethrin (ACTICIN) 5 % cream   Topical    Apply 1 application topically once.   60 g   0   . Prenatal Vit-Fe Fumarate-FA (PRENATAL MULTIVITAMIN) TABS tablet   Oral   Take 1 tablet by mouth at bedtime.         . sertraline (ZOLOFT) 50 MG tablet   Oral   Take 1 tablet (50 mg total) by mouth daily.   90 tablet   1     Allergies Review of patient's allergies indicates no known allergies.  Family History  Problem Relation Age of Onset  . Alcohol abuse Neg Hx   . Arthritis Neg Hx   . Birth defects Neg Hx   . COPD Neg Hx   . Depression Neg Hx   . Early death Neg Hx   . Hearing loss Neg Hx   . Heart disease Neg Hx   . Hyperlipidemia Neg Hx   . Kidney disease Neg Hx   . Learning disabilities Neg Hx   . Mental illness Neg Hx   . Mental retardation Neg Hx   . Miscarriages / Stillbirths Neg Hx   . Stroke Neg Hx   . Vision loss Neg Hx   . Asthma Mother   . Hypertension Father   . Cancer Maternal Grandfather   . Diabetes Maternal Grandfather   . Cancer Paternal Grandfather     Social History Social  History  Substance Use Topics  . Smoking status: Former Smoker -- 0.50 packs/day for 2 years    Types: Cigarettes  . Smokeless tobacco: Never Used  . Alcohol Use: No    Review of Systems Constitutional: Positive fever/chills Eyes: No visual changes. ENT: Positive sore throat. Positive for runny nose. Cardiovascular: Denies chest pain. Respiratory: Denies shortness of breath. Positive for cough Gastrointestinal: No abdominal pain.  No nausea, no vomiting.  No diarrhea.  No constipation. Genitourinary: Negative for dysuria. Musculoskeletal: Negative for back pain. Skin: Negative for rash. Neurological: Negative for headaches, focal weakness or numbness.  10-point ROS otherwise negative.  ____________________________________________   PHYSICAL EXAM:  VITAL SIGNS: ED Triage Vitals  Enc Vitals Group     BP 04/06/15 0945 111/65 mmHg     Pulse Rate 04/06/15 0945 100     Resp 04/06/15 0945 18     Temp  04/06/15 0945 99.5 F (37.5 C)     Temp Source 04/06/15 0945 Oral     SpO2 04/06/15 0945 98 %     Weight 04/06/15 0945 134 lb (60.782 kg)     Height 04/06/15 0945  (1.6 m)     Head Cir --      Peak Flow --      Pain Score 04/06/15 0946 6     Pain Loc --      Pain Edu? --      Excl. in GC? --     Constitutional: Alert and oriented. Well appearing and in no acute distress. Eyes: Conjunctivae are normal. PERRL. EOMI. Nose: Positive congestion/rhinnorhea. Mouth/Throat: Mucous membranes are moist.  Oropharynx non-erythematous. Neck: No stridor.  Full range of motion nontender Cardiovascular: Normal rate, regular rhythm. Grossly normal heart sounds.  Good peripheral circulation. Respiratory: Normal respiratory effort.  No retractions. Lungs CTAB. Musculoskeletal: No lower extremity tenderness nor edema.  No joint effusions. Neurologic:  Normal speech and language. No gross focal neurologic deficits are appreciated. No gait instability. Skin:  Skin is warm, dry and intact. No rash noted. Psychiatric: Mood and affect are normal. Speech and behavior are normal.  ____________________________________________   LABS (all labs ordered are listed, but only abnormal results are displayed)  Labs Reviewed - No data to display    PROCEDURES  Procedure(s) performed: None  Critical Care performed: No  ____________________________________________   INITIAL IMPRESSION / ASSESSMENT AND PLAN / ED COURSE  Pertinent labs & imaging results that were available during my care of the patient were reviewed by me and considered in my medical decision making (see chart for details).  Acute upper respiratory infection/strep throat since yesterday. [facility]. Rx given for Robitussin-AC, chlorpheniramine, Motrin 800 mg. Work excuse extended 2 days. Patient follow-up with PCP or return to the ER with any worsening  symptomology. ____________________________________________   FINAL CLINICAL IMPRESSION(S) / ED DIAGNOSES  Final diagnoses:  URI, acute     This chart was dictated using voice recognition software/Dragon. Despite best efforts to proofread, errors can occur which can change the meaning. Any change was purely unintentional.   Evangeline Dakin, PA-C 04/06/15 1632  Phineas Semen, MD 04/06/15 2010

## 2015-04-07 ENCOUNTER — Encounter: Payer: Self-pay | Admitting: *Deleted

## 2015-04-07 ENCOUNTER — Emergency Department
Admission: EM | Admit: 2015-04-07 | Discharge: 2015-04-07 | Disposition: A | Discharge status: Home / Self Care | Payer: BLUE CROSS/BLUE SHIELD | Attending: Emergency Medicine | Admitting: Emergency Medicine

## 2015-04-07 DIAGNOSIS — J029 Acute pharyngitis, unspecified: Secondary | ICD-10-CM | POA: Diagnosis not present

## 2015-04-07 DIAGNOSIS — Z791 Long term (current) use of non-steroidal anti-inflammatories (NSAID): Secondary | ICD-10-CM | POA: Diagnosis not present

## 2015-04-07 DIAGNOSIS — Z79899 Other long term (current) drug therapy: Secondary | ICD-10-CM | POA: Insufficient documentation

## 2015-04-07 DIAGNOSIS — F17210 Nicotine dependence, cigarettes, uncomplicated: Secondary | ICD-10-CM | POA: Insufficient documentation

## 2015-04-07 DIAGNOSIS — R112 Nausea with vomiting, unspecified: Secondary | ICD-10-CM | POA: Diagnosis not present

## 2015-04-07 LAB — POCT PREGNANCY, URINE: Preg Test, Ur: NEGATIVE

## 2015-04-07 MED ORDER — ONDANSETRON HCL 4 MG PO TABS: 4.0000 mg | ORAL_TABLET | Freq: Three times a day (TID) | ORAL | Status: DC | PRN

## 2015-04-07 MED ORDER — PROMETHAZINE HCL 25 MG RE SUPP: 25.0000 mg | Freq: Four times a day (QID) | RECTAL | Status: DC | PRN

## 2015-04-07 MED ORDER — ONDANSETRON HCL 4 MG/2ML IJ SOLN
INTRAMUSCULAR | Status: AC
  Administered 2015-04-07: 4 mg via INTRAVENOUS
  Filled 2015-04-07: qty 2

## 2015-04-07 MED ORDER — DEXAMETHASONE SODIUM PHOSPHATE 10 MG/ML IJ SOLN
10.0000 mg | Freq: Once | INTRAMUSCULAR | Status: AC
  Administered 2015-04-07: 10 mg via INTRAVENOUS
  Filled 2015-04-07: qty 1

## 2015-04-07 MED ORDER — AMOXICILLIN 500 MG PO CAPS
ORAL_CAPSULE | ORAL | Status: AC
  Administered 2015-04-07: 500 mg via ORAL
  Filled 2015-04-07: qty 1

## 2015-04-07 MED ORDER — SODIUM CHLORIDE 0.9 % IV BOLUS (SEPSIS)
1000.0000 mL | Freq: Once | INTRAVENOUS | Status: AC
  Administered 2015-04-07: 1000 mL via INTRAVENOUS

## 2015-04-07 MED ORDER — ACETAMINOPHEN 325 MG PO TABS
ORAL_TABLET | ORAL | Status: AC
  Filled 2015-04-07: qty 2

## 2015-04-07 MED ORDER — DEXAMETHASONE SODIUM PHOSPHATE 10 MG/ML IJ SOLN
INTRAMUSCULAR | Status: AC
  Administered 2015-04-07: 10 mg via INTRAVENOUS
  Filled 2015-04-07: qty 1

## 2015-04-07 MED ORDER — ONDANSETRON HCL 4 MG/2ML IJ SOLN
4.0000 mg | Freq: Once | INTRAMUSCULAR | Status: AC
  Administered 2015-04-07: 4 mg via INTRAVENOUS

## 2015-04-07 MED ORDER — AMOXICILLIN 500 MG PO CAPS
500.0000 mg | ORAL_CAPSULE | Freq: Once | ORAL | Status: AC
  Administered 2015-04-07: 500 mg via ORAL

## 2015-04-07 MED ORDER — ACETAMINOPHEN 325 MG PO TABS
650.0000 mg | ORAL_TABLET | Freq: Once | ORAL | Status: AC
  Administered 2015-04-07: 650 mg via ORAL

## 2015-04-07 NOTE — ED Notes (Signed)
Tolerating PO.  No N/V.  Ambulated independently to BR, tolerated well.

## 2015-04-07 NOTE — ED Notes (Addendum)
Pt seen at urgent Monday for strep throat, pt has been taking antibiotic, pt reports sore throat is worse and vomiting, pt reports not being able to eat or drink

## 2015-04-07 NOTE — ED Provider Notes (Signed)
[facility] Emergency Department Provider Note   ____________________________________________  Time seen: ~1810  I have reviewed the triage vital signs and the nursing notes.   HISTORY  Chief Complaint Sore Throat   History limited by: Not Limited   HPI Kelly Burch is a [age] y.o. female who presents to the emergency department today with concerns for continued sore throat, vomiting after recently being diagnosed with strep throat. Patient states symptoms started 3 days ago. She was seen on urgent care on Monday. She started on antibiotics that day. She states that she was also given pain medication here in the emergency department. She states she has continued to have sore throat. In addition she has had nausea and vomiting. She feels like she is unable to keep down any medication or food. She denies any abdominal pain. Denies any diarrhea or bloody stool.    Past Medical History  Diagnosis Date  . Medical history non-contributory     Patient Active Problem List   Diagnosis Date Noted  . Pregnancy 03/05/2013  . Polyhydramnios in third trimester, antepartum complication 03/04/2013  . GBS (group B Streptococcus carrier), +RV culture, currently pregnant 02/12/2013  . Traumatic injury during pregnancy in second trimester 11/27/2012  . Supervision of normal first teen pregnancy in second trimester 11/06/2012    Past Surgical History  Procedure Laterality Date  . No past surgeries    . Tonsillectomy    . Eye surgery      Current Outpatient Rx  Name  Route  Sig  Dispense  Refill  . chlorpheniramine (CHLOR-TRIMETON) 4 MG tablet   Oral   Take 1 tablet (4 mg total) by mouth 2 (two) times daily as needed for allergies or rhinitis.   30 tablet   0   . guaiFENesin-codeine 100-10 MG/5ML syrup   Oral   Take 10 mLs by mouth every 4 (four) hours as needed for cough.   180 mL   0   . ibuprofen (ADVIL,MOTRIN) 800 MG tablet   Oral   Take 1 tablet  (800 mg total) by mouth every 8 (eight) hours as needed.   30 tablet   0   . levonorgestrel (MIRENA) 20 MCG/24HR IUD   Intrauterine   1 Intra Uterine Device (1 each total) by Intrauterine route once.   1 each   0   . norethindrone-ethinyl estradiol (TRIPHASIL,CYCLAFEM,ALYACEN) 0.5/0.75/1-35 MG-MCG tablet   Oral   Take 1 tablet by mouth daily.   1 Package   0   . permethrin (ACTICIN) 5 % cream   Topical   Apply 1 application topically once.   60 g   0   . Prenatal Vit-Fe Fumarate-FA (PRENATAL MULTIVITAMIN) TABS tablet   Oral   Take 1 tablet by mouth at bedtime.         . sertraline (ZOLOFT) 50 MG tablet   Oral   Take 1 tablet (50 mg total) by mouth daily.   90 tablet   1     Allergies Review of patient's allergies indicates no known allergies.  Family History  Problem Relation Age of Onset  . Alcohol abuse Neg Hx   . Arthritis Neg Hx   . Birth defects Neg Hx   . COPD Neg Hx   . Depression Neg Hx   . Early death Neg Hx   . Hearing loss Neg Hx   . Heart disease Neg Hx   . Hyperlipidemia Neg Hx   . Kidney disease Neg Hx   .  Learning disabilities Neg Hx   . Mental illness Neg Hx   . Mental retardation Neg Hx   . Miscarriages / Stillbirths Neg Hx   . Stroke Neg Hx   . Vision loss Neg Hx   . Asthma Mother   . Hypertension Father   . Cancer Maternal Grandfather   . Diabetes Maternal Grandfather   . Cancer Paternal Grandfather     Social History Social History  Substance Use Topics  . Smoking status: Former Smoker -- 0.50 packs/day for 2 years    Types: Cigarettes  . Smokeless tobacco: Never Used  . Alcohol Use: No    Review of Systems  Constitutional: Negative for fever. Cardiovascular: Negative for chest pain. Respiratory: Negative for shortness of breath. Gastrointestinal: Negative for abdominal pain, vomiting and diarrhea. Neurological: Negative for headaches, focal weakness or numbness.  10-point ROS otherwise  negative.  ____________________________________________   PHYSICAL EXAM:  VITAL SIGNS: ED Triage Vitals  Enc Vitals Group     BP --      Pulse Rate 04/07/15 1750 120     Resp 04/07/15 1750 20     Temp 04/07/15 1750 99.7 F (37.6 C)     Temp Source 04/07/15 1750 Oral     SpO2 04/07/15 1750 98 %     Weight 04/07/15 1750 133 lb (60.328 kg)     Height 04/07/15 1750  (1.6 m)     Head Cir --      Peak Flow --      Pain Score 04/07/15 1751 0   Constitutional: Alert and oriented. Well appearing and in no distress. Eyes: Conjunctivae are normal. PERRL. Normal extraocular movements. ENT   Head: Normocephalic and atraumatic.   Nose: No congestion/rhinnorhea.   Mouth/Throat: Mucous membranes are moist.   Neck: No stridor. Hematological/Lymphatic/Immunilogical: No cervical lymphadenopathy. Cardiovascular: Normal rate, regular rhythm.  No murmurs, rubs, or gallops. Respiratory: Normal respiratory effort without tachypnea nor retractions. Breath sounds are clear and equal bilaterally. No wheezes/rales/rhonchi. Gastrointestinal: Soft and nontender. No distention.  Genitourinary: Deferred Musculoskeletal: Normal range of motion in all extremities. No joint effusions.  No lower extremity tenderness nor edema. Neurologic:  Normal speech and language. No gross focal neurologic deficits are appreciated.  Skin:  Skin is warm, dry and intact. No rash noted. Psychiatric: Mood and affect are normal. Speech and behavior are normal. Patient exhibits appropriate insight and judgment.  ____________________________________________    LABS (pertinent positives/negatives)  Labs Reviewed  POCT PREGNANCY, URINE     ____________________________________________   EKG  None  ____________________________________________    RADIOLOGY  None  ____________________________________________   PROCEDURES  Procedure(s) performed: None  Critical Care performed:  No  ____________________________________________   INITIAL IMPRESSION / ASSESSMENT AND PLAN / ED COURSE  Pertinent labs & imaging results that were available during my care of the patient were reviewed by me and considered in my medical decision making (see chart for details).  Patient presented today because of continued sore throat and vomiting. Patient's initially tachycardic however did respond well to IV hydration. She was given steroid and antiemetics here. Patient was able to tolerate by mouth fluids and medication prior to discharge. Will discharge home with antiemetics.  ____________________________________________   FINAL CLINICAL IMPRESSION(S) / ED DIAGNOSES  Final diagnoses:  Pharyngitis  Nausea and vomiting, vomiting of unspecified type     Phineas Semen, MD 04/07/15 2205

## 2015-04-07 NOTE — Discharge Instructions (Signed)
Please seek medical attention for any high fevers, chest pain, shortness of breath, change in behavior, persistent vomiting, bloody stool or any other new or concerning symptoms. ° ° °Nausea and Vomiting °Nausea is a sick feeling that often comes before throwing up (vomiting). Vomiting is a reflex where stomach contents come out of your mouth. Vomiting can cause severe loss of body fluids (dehydration). Children and elderly adults can become dehydrated quickly, especially if they also have diarrhea. Nausea and vomiting are symptoms of a condition or disease. It is important to find the cause of your symptoms. °CAUSES  °· Direct irritation of the stomach lining. This irritation can result from increased acid production (gastroesophageal reflux disease), infection, food poisoning, taking certain medicines (such as nonsteroidal anti-inflammatory drugs), alcohol use, or tobacco use. °· Signals from the brain. These signals could be caused by a headache, heat exposure, an inner ear disturbance, increased pressure in the brain from injury, infection, a tumor, or a concussion, pain, emotional stimulus, or metabolic problems. °· An obstruction in the gastrointestinal tract (bowel obstruction). °· Illnesses such as diabetes, hepatitis, gallbladder problems, appendicitis, kidney problems, cancer, sepsis, atypical symptoms of a heart attack, or eating disorders. °· Medical treatments such as chemotherapy and radiation. °· Receiving medicine that makes you sleep (general anesthetic) during surgery. °DIAGNOSIS °Your caregiver may ask for tests to be done if the problems do not improve after a few days. Tests may also be done if symptoms are severe or if the reason for the nausea and vomiting is not clear. Tests may include: °· Urine tests. °· Blood tests. °· Stool tests. °· Cultures (to look for evidence of infection). °· X-rays or other imaging studies. °Test results can help your caregiver make decisions about treatment or the  need for additional tests. °TREATMENT °You need to stay well hydrated. Drink frequently but in small amounts. You may wish to drink water, sports drinks, clear broth, or eat frozen ice pops or gelatin dessert to help stay hydrated. When you eat, eating slowly may help prevent nausea. There are also some antinausea medicines that may help prevent nausea. °HOME CARE INSTRUCTIONS  °· Take all medicine as directed by your caregiver. °· If you do not have an appetite, do not force yourself to eat. However, you must continue to drink fluids. °· If you have an appetite, eat a normal diet unless your caregiver tells you differently. °¨ Eat a variety of complex carbohydrates (rice, wheat, potatoes, bread), lean meats, yogurt, fruits, and vegetables. °¨ Avoid high-fat foods because they are more difficult to digest. °· Drink enough water and fluids to keep your urine clear or pale yellow. °· If you are dehydrated, ask your caregiver for specific rehydration instructions. Signs of dehydration may include: °¨ Severe thirst. °¨ Dry lips and mouth. °¨ Dizziness. °¨ Dark urine. °¨ Decreasing urine frequency and amount. °¨ Confusion. °¨ Rapid breathing or pulse. °SEEK IMMEDIATE MEDICAL CARE IF:  °· You have blood or brown flecks (like coffee grounds) in your vomit. °· You have black or bloody stools. °· You have a severe headache or stiff neck. °· You are confused. °· You have severe abdominal pain. °· You have chest pain or trouble breathing. °· You do not urinate at least once every 8 hours. °· You develop cold or clammy skin. °· You continue to vomit for longer than 24 to 48 hours. °· You have a fever. °MAKE SURE YOU:  °· Understand these instructions. °· Will watch your condition. °· Will get help right away   if you are not doing well or get worse. °  °This information is not intended to replace advice given to you by your health care provider. Make sure you discuss any questions you have with your health care provider. °    °Document Released: 12/26/2004 Document Revised: 03/20/2011 Document Reviewed: 05/25/2010 °[employer] Interactive Patient Education ©2016 [employer] ° °

## 2016-04-10 ENCOUNTER — Encounter: Payer: Self-pay | Admitting: Family

## 2018-10-10 HISTORY — PX: OTHER SURGICAL HISTORY: SHX169

## 2019-09-18 DIAGNOSIS — Z2821 Immunization not carried out because of patient refusal: Secondary | ICD-10-CM | POA: Insufficient documentation

## 2019-10-20 ENCOUNTER — Encounter: Payer: Self-pay | Admitting: Obstetrics

## 2019-10-20 ENCOUNTER — Other Ambulatory Visit: Payer: Self-pay

## 2019-10-20 ENCOUNTER — Ambulatory Visit (INDEPENDENT_AMBULATORY_CARE_PROVIDER_SITE_OTHER): Payer: Medicaid Other | Admitting: Obstetrics

## 2019-10-20 ENCOUNTER — Other Ambulatory Visit (HOSPITAL_COMMUNITY)
Admission: RE | Admit: 2019-10-20 | Discharge: 2019-10-20 | Disposition: A | Discharge status: Home / Self Care | Payer: BC Managed Care – PPO | Source: Ambulatory Visit | Attending: Obstetrics | Admitting: Obstetrics

## 2019-10-20 VITALS — Wt 125.0 lb

## 2019-10-20 DIAGNOSIS — Z3481 Encounter for supervision of other normal pregnancy, first trimester: Secondary | ICD-10-CM | POA: Insufficient documentation

## 2019-10-20 DIAGNOSIS — Z3A08 8 weeks gestation of pregnancy: Secondary | ICD-10-CM

## 2019-10-20 DIAGNOSIS — Z3480 Encounter for supervision of other normal pregnancy, unspecified trimester: Secondary | ICD-10-CM | POA: Insufficient documentation

## 2019-10-20 DIAGNOSIS — N912 Amenorrhea, unspecified: Secondary | ICD-10-CM | POA: Insufficient documentation

## 2019-10-20 LAB — POCT URINALYSIS DIPSTICK OB
Glucose, UA: NEGATIVE
POC,PROTEIN,UA: NEGATIVE
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
## 2019-10-20 NOTE — Progress Notes (Signed)
C/O LMP unknown; preg confirmed via blood test at [facility] in [facility]; constant nausea through the day; is it okay to use clobetasole ointment for exzema? Exersice?

## 2019-10-22 ENCOUNTER — Telehealth: Payer: Self-pay

## 2019-10-22 LAB — CYTOLOGY - PAP
Chlamydia: NEGATIVE
Comment: NEGATIVE
Comment: NEGATIVE
Comment: NORMAL
Diagnosis: NEGATIVE
Diagnosis: REACTIVE
Neisseria Gonorrhea: NEGATIVE
Trichomonas: NEGATIVE

## 2019-10-22 LAB — URINE CULTURE

## 2019-10-22 NOTE — Telephone Encounter (Signed)
Pt calling to see if urine culture is normal.  [phone]  Adv it is normal.

## 2019-10-29 ENCOUNTER — Encounter: Payer: Self-pay | Admitting: Advanced Practice Midwife

## 2019-10-29 ENCOUNTER — Other Ambulatory Visit: Payer: Self-pay

## 2019-10-29 ENCOUNTER — Ambulatory Visit (INDEPENDENT_AMBULATORY_CARE_PROVIDER_SITE_OTHER): Payer: Medicaid Other

## 2019-10-29 ENCOUNTER — Ambulatory Visit (INDEPENDENT_AMBULATORY_CARE_PROVIDER_SITE_OTHER): Payer: Medicaid Other | Admitting: Advanced Practice Midwife

## 2019-10-29 VITALS — BP 118/74 | Wt 128.0 lb

## 2019-10-29 DIAGNOSIS — N912 Amenorrhea, unspecified: Secondary | ICD-10-CM

## 2019-10-29 DIAGNOSIS — Z3481 Encounter for supervision of other normal pregnancy, first trimester: Secondary | ICD-10-CM

## 2019-10-29 DIAGNOSIS — Z3480 Encounter for supervision of other normal pregnancy, unspecified trimester: Secondary | ICD-10-CM

## 2019-10-29 DIAGNOSIS — Z3A09 9 weeks gestation of pregnancy: Secondary | ICD-10-CM

## 2019-10-29 DIAGNOSIS — Z3A08 8 weeks gestation of pregnancy: Secondary | ICD-10-CM | POA: Diagnosis not present

## 2019-10-29 NOTE — Progress Notes (Signed)
Routine Prenatal Care Visit  Subjective  Kelly Burch is a 24 y.o. G2P1001 at [redacted]w[redacted]d being seen today for ongoing prenatal care.  She is currently monitored for the following issues for this low-risk pregnancy and has GBS (group B Streptococcus carrier), +RV culture, currently pregnant; Pregnancy; and Supervision of other normal pregnancy, antepartum on their problem list.  ----------------------------------------------------------------------------------- Patient reports nausea.  No lab tech available at time of patient's appointment. Advised better to wait until 10 weeks for more accurate testing. Offered to put in order as lab draw. She prefers to return on Monday morning for labs.  . Vag. Bleeding: None.   . Leaking Fluid denies.  ----------------------------------------------------------------------------------- The following portions of the patient's history were reviewed and updated as appropriate: allergies, current medications, past family history, past medical history, past social history, past surgical history and problem list. Problem list updated.  Objective  Blood pressure 118/74, weight 128 lb (58.1 kg), last menstrual period 08/24/2019. Pregravid weight 122 lb (55.3 kg) Total Weight Gain 6 lb (2.722 kg) Urinalysis: Urine Protein    Urine Glucose    Fetal Status: Fetal Heart Rate (bpm): 173          Dating scan: EDD by today's ultrasound as LMP was approximate within weeks   General:  Alert, oriented and cooperative. Patient is in no acute distress.  Skin: Skin is warm and dry. No rash noted.   Cardiovascular: Normal heart rate noted  Respiratory: Normal respiratory effort, no problems with respiration noted  Abdomen: Soft, gravid, appropriate for gestational age.       Pelvic:  Cervical exam deferred        Extremities: Normal range of motion.     Mental Status: Normal mood and affect. Normal behavior. Normal judgment and thought content.   Assessment   24 y.o.  G2P1001 at [redacted]w[redacted]d by  05/29/2020, by Ultrasound presenting for routine prenatal visit  Plan   THIRD Problems (from 10/20/19 to present)    Problem Noted Resolved   Supervision of other normal pregnancy, antepartum 10/20/2019 by Mirna Mires, CNM No   Overview Addendum 10/24/2019  3:10 PM by Mirna Mires, CNM     Clinic  Prenatal Labs  Dating EDD by 9w u/s Blood type:     Genetic Screen 1 Screen:    AFP:     Quad:     NIPS: Antibody:   Anatomic [place]  Rubella:    GTT Early:   NA             Third trimester:  RPR:     Flu vaccine  HBsAg:     TDaP vaccine                                               Rhogam: HIV:     Baby Food                                               GBS: (For PCN allergy, check sensitivities)  Contraception  ASN:KNLZ  Circumcision    Pediatrician    Support Person              Previous Version       Preterm labor symptoms and  general obstetric precautions including but not limited to vaginal bleeding, contractions, leaking of fluid and fetal movement were reviewed in detail with the patient.   Return for lab only visit on 10/25 (MaT 21/NOB panel) and rob in 4 weeks.  Tresea Mall, CNM 10/29/2019 4:28 PM

## 2019-10-29 NOTE — Progress Notes (Signed)
No vb. No lof. Constant nausea

## 2019-11-03 ENCOUNTER — Other Ambulatory Visit: Payer: Self-pay

## 2019-11-03 ENCOUNTER — Other Ambulatory Visit: Payer: Medicaid Other

## 2019-11-03 DIAGNOSIS — Z3A08 8 weeks gestation of pregnancy: Secondary | ICD-10-CM

## 2019-11-03 DIAGNOSIS — N912 Amenorrhea, unspecified: Secondary | ICD-10-CM

## 2019-11-03 DIAGNOSIS — Z3481 Encounter for supervision of other normal pregnancy, first trimester: Secondary | ICD-10-CM

## 2019-11-03 DIAGNOSIS — Z3480 Encounter for supervision of other normal pregnancy, unspecified trimester: Secondary | ICD-10-CM

## 2019-11-03 LAB — OB RESULTS CONSOLE VARICELLA ZOSTER ANTIBODY, IGG: Varicella: IMMUNE

## 2019-11-04 LAB — RPR+RH+ABO+RUB AB+AB SCR+CB...
Antibody Screen: NEGATIVE
HIV Screen 4th Generation wRfx: NONREACTIVE
Hematocrit: 38.4 % (ref 34.0–46.6)
Hemoglobin: 12.5 g/dL (ref 11.1–15.9)
Hepatitis B Surface Ag: NEGATIVE
MCH: 29.8 pg (ref 26.6–33.0)
MCHC: 32.6 g/dL (ref 31.5–35.7)
MCV: 91 fL (ref 79–97)
Platelets: 183 10*3/uL (ref 150–450)
RBC: 4.2 x10E6/uL (ref 3.77–5.28)
RDW: 14.6 % (ref 11.7–15.4)
RPR Ser Ql: NONREACTIVE
Rh Factor: POSITIVE
Rubella Antibodies, IGG: 15.3 index (ref >0.99)
Varicella zoster IgG: 2337 index (ref >165)
WBC: 7.3 10*3/uL (ref 3.4–10.8)

## 2019-11-07 LAB — MATERNIT 21 PLUS CORE, BLOOD
Fetal Fraction: 13
Result (T21): NEGATIVE
Trisomy 13 (Patau syndrome): NEGATIVE
Trisomy 18 (Edwards syndrome): NEGATIVE
Trisomy 21 (Down syndrome): NEGATIVE

## 2019-11-26 ENCOUNTER — Ambulatory Visit (INDEPENDENT_AMBULATORY_CARE_PROVIDER_SITE_OTHER): Payer: Medicaid Other | Admitting: Advanced Practice Midwife

## 2019-11-26 ENCOUNTER — Other Ambulatory Visit: Payer: Self-pay

## 2019-11-26 ENCOUNTER — Encounter: Payer: Self-pay | Admitting: Advanced Practice Midwife

## 2019-11-26 VITALS — BP 110/70 | Ht 63.0 in | Wt 130.6 lb

## 2019-11-26 DIAGNOSIS — Z3A13 13 weeks gestation of pregnancy: Secondary | ICD-10-CM

## 2019-11-26 DIAGNOSIS — Z3482 Encounter for supervision of other normal pregnancy, second trimester: Secondary | ICD-10-CM

## 2019-11-26 LAB — POCT URINALYSIS DIPSTICK OB
Glucose, UA: NEGATIVE
POC,PROTEIN,UA: NEGATIVE

## 2019-11-26 NOTE — Progress Notes (Signed)
Routine Prenatal Care Visit  Subjective  Kelly Burch is a 24 y.o. G2P1001 at [redacted]w[redacted]d being seen today for ongoing prenatal care.  She is currently monitored for the following issues for this low-risk pregnancy and has Supervision of other normal pregnancy, antepartum and COVID-19 virus vaccination declined on their problem list.  ----------------------------------------------------------------------------------- Patient reports no complaints.  She hasn't had nausea for the past 2 weeks.  . Vag. Bleeding: None.   . Leaking Fluid denies.  ----------------------------------------------------------------------------------- The following portions of the patient's history were reviewed and updated as appropriate: allergies, current medications, past family history, past medical history, past social history, past surgical history and problem list. Problem list updated.  Objective  Blood pressure 110/70, height 5\' 3"  (1.6 m), weight 130 lb 9.6 oz (59.2 kg), last menstrual period 08/24/2019. Pregravid weight 122 lb (55.3 kg) Total Weight Gain 8 lb 9.6 oz (3.901 kg) Urinalysis: Urine Protein    Urine Glucose    Fetal Status: Fetal Heart Rate (bpm): 150         General:  Alert, oriented and cooperative. Patient is in no acute distress.  Skin: Skin is warm and dry. No rash noted.   Cardiovascular: Normal heart rate noted  Respiratory: Normal respiratory effort, no problems with respiration noted  Abdomen: Soft, gravid, appropriate for gestational age.       Pelvic:  Cervical exam deferred        Extremities: Normal range of motion.  Edema: None  Mental Status: Normal mood and affect. Normal behavior. Normal judgment and thought content.   Assessment   24 y.o. G2P1001 at [redacted]w[redacted]d by  05/29/2020, by Ultrasound presenting for routine prenatal visit  Plan   THIRD Problems (from 10/20/19 to present)    Problem Noted Resolved   Supervision of other normal pregnancy, antepartum 10/20/2019 by 12/20/2019, CNM No   Overview Addendum 11/05/2019 11:47 AM by 11/07/2019, CNM     Clinic  Prenatal Labs  Dating EDD by 9w u/s Blood type:   A+  Genetic Screen 1 Screen:    AFP:     Quad:     NIPS: Antibody: negative  Anatomic Mirna Mires  Rubella:  immune  GTT Early:  NA             Third trimester:  RPR:   NR  Flu vaccine  HBsAg:   negative  TDaP vaccine                                               Rhogam: HIV:   NR  Baby Food                                               GBS: (For PCN allergy, check sensitivities)  Contraception  [place]  Circumcision    Pediatrician    Support Person              Previous Version       Preterm labor symptoms and general obstetric precautions including but not limited to vaginal bleeding, contractions, leaking of fluid and fetal movement were reviewed in detail with the patient. Please refer to After Visit Summary for other counseling recommendations.   Return in about 3 weeks (  around 12/17/2019) for rob.  Tresea Mall, CNM 11/26/2019 8:24 AM

## 2019-11-26 NOTE — Patient Instructions (Signed)

## 2019-12-16 ENCOUNTER — Encounter: Payer: Self-pay | Admitting: Advanced Practice Midwife

## 2019-12-16 ENCOUNTER — Other Ambulatory Visit: Payer: Self-pay

## 2019-12-16 ENCOUNTER — Ambulatory Visit (INDEPENDENT_AMBULATORY_CARE_PROVIDER_SITE_OTHER): Payer: Medicaid Other | Admitting: Advanced Practice Midwife

## 2019-12-16 VITALS — BP 108/68 | Ht 63.0 in | Wt 135.4 lb

## 2019-12-16 DIAGNOSIS — Z3482 Encounter for supervision of other normal pregnancy, second trimester: Secondary | ICD-10-CM

## 2019-12-16 DIAGNOSIS — Z3A16 16 weeks gestation of pregnancy: Secondary | ICD-10-CM

## 2019-12-16 LAB — POCT URINALYSIS DIPSTICK OB
Glucose, UA: NEGATIVE
POC,PROTEIN,UA: NEGATIVE

## 2019-12-16 NOTE — Progress Notes (Signed)
ROB

## 2019-12-16 NOTE — Progress Notes (Signed)
Routine Prenatal Care Visit  Subjective  [name] is a 24 y.o. G2P1001 at [redacted]w[redacted]d being seen today for ongoing prenatal care.  She is currently monitored for the following issues for this low-risk pregnancy and has Supervision of other normal pregnancy, antepartum and COVID-19 virus vaccination declined on their problem list.  ----------------------------------------------------------------------------------- Patient reports no complaints.  She requests anatomy scan at 19 weeks due to work concerns beginning in January.  . Vag. Bleeding: None.  Movement: Present. Leaking Fluid denies.  ----------------------------------------------------------------------------------- The following portions of the patient's history were reviewed and updated as appropriate: allergies, current medications, past family history, past medical history, past social history, past surgical history and problem list. Problem list updated.  Objective  Blood pressure 108/68, height 5\' 3"  (1.6 m), weight 135 lb 6.4 oz (61.4 kg), last menstrual period 08/24/2019. Pregravid weight 122 lb (55.3 kg) Total Weight Gain 13 lb 6.4 oz (6.078 kg) Urinalysis: Urine Protein Negative  Urine Glucose Negative  Fetal Status: Fetal Heart Rate (bpm): 148   Movement: Present     General:  Alert, oriented and cooperative. Patient is in no acute distress.  Skin: Skin is warm and dry. No rash noted.   Cardiovascular: Normal heart rate noted  Respiratory: Normal respiratory effort, no problems with respiration noted  Abdomen: Soft, gravid, appropriate for gestational age.       Pelvic:  Cervical exam deferred        Extremities: Normal range of motion.  Edema: None  Mental Status: Normal mood and affect. Normal behavior. Normal judgment and thought content.   Assessment   23 y.o. G2P1001 at [redacted]w[redacted]d by  05/29/2020, by Ultrasound presenting for routine prenatal visit  Plan   THIRD Problems (from 10/20/19 to present)    Problem  Noted Resolved   Supervision of other normal pregnancy, antepartum 10/20/2019 by 12/20/2019, CNM No   Overview Addendum 11/05/2019 11:47 AM by 11/07/2019, CNM     Clinic  Prenatal Labs  Dating EDD by 9w u/s Blood type:   A+  Genetic Screen 1 Screen:    AFP:     Quad:     NIPS: Antibody: negative  Anatomic Mirna Mires  Rubella:  immune  GTT Early:  NA             Third trimester:  RPR:   NR  Flu vaccine  HBsAg:   negative  TDaP vaccine                                               Rhogam: HIV:   NR  Baby Food                                               GBS: (For PCN allergy, check sensitivities)  Contraception  [place]  Circumcision    Pediatrician    Support Person              Previous Version       Preterm labor symptoms and general obstetric precautions including but not limited to vaginal bleeding, contractions, leaking of fluid and fetal movement were reviewed in detail with the patient.   Return in about 3 weeks (around 01/06/2020) for anatomy scan and rob.  Tresea Mall, CNM 12/16/2019 11:24 AM

## 2020-01-06 ENCOUNTER — Ambulatory Visit (INDEPENDENT_AMBULATORY_CARE_PROVIDER_SITE_OTHER): Payer: Medicaid Other

## 2020-01-06 ENCOUNTER — Other Ambulatory Visit: Payer: Self-pay

## 2020-01-06 ENCOUNTER — Encounter: Payer: Self-pay | Admitting: Obstetrics and Gynecology

## 2020-01-06 ENCOUNTER — Ambulatory Visit (INDEPENDENT_AMBULATORY_CARE_PROVIDER_SITE_OTHER): Payer: Medicaid Other | Admitting: Obstetrics and Gynecology

## 2020-01-06 VITALS — BP 118/60 | Wt 137.0 lb

## 2020-01-06 DIAGNOSIS — Z3A19 19 weeks gestation of pregnancy: Secondary | ICD-10-CM

## 2020-01-06 DIAGNOSIS — Z3480 Encounter for supervision of other normal pregnancy, unspecified trimester: Secondary | ICD-10-CM

## 2020-01-06 DIAGNOSIS — Z3482 Encounter for supervision of other normal pregnancy, second trimester: Secondary | ICD-10-CM

## 2020-01-06 LAB — POCT URINALYSIS DIPSTICK OB
Ketones, UA: NEGATIVE
POC,PROTEIN,UA: NEGATIVE

## 2020-01-06 NOTE — Progress Notes (Signed)
Routine Prenatal Care Visit  Subjective  Kelly Burch is a 24 y.o. G2P1001 at [redacted]w[redacted]d being seen today for ongoing prenatal care.  She is currently monitored for the following issues for this low-risk pregnancy and has Supervision of other normal pregnancy, antepartum and COVID-19 virus vaccination declined on their problem list.  ----------------------------------------------------------------------------------- Patient reports no complaints.    . Vag. Bleeding: None.  Movement: Present. Denies leaking of fluid.  ----------------------------------------------------------------------------------- The following portions of the patient's history were reviewed and updated as appropriate: allergies, current medications, past family history, past medical history, past social history, past surgical history and problem list. Problem list updated.   Objective  Blood pressure 118/60, weight 137 lb (62.1 kg), last menstrual period 08/24/2019. Pregravid weight 122 lb (55.3 kg) Total Weight Gain 15 lb (6.804 kg) Urinalysis:      Fetal Status: Fetal Heart Rate (bpm): 143 ([place])   Movement: Present     General:  Alert, oriented and cooperative. Patient is in no acute distress.  Skin: Skin is warm and dry. No rash noted.   Cardiovascular: Normal heart rate noted  Respiratory: Normal respiratory effort, no problems with respiration noted  Abdomen: Soft, gravid, appropriate for gestational age. Pain/Pressure: Absent     Pelvic:  Cervical exam deferred        Extremities: Normal range of motion.  Edema: None  ental Status: Normal mood and affect. Normal behavior. Normal judgment and thought content.     Assessment   24 y.o. G2P1001 at [redacted]w[redacted]d by  05/29/2020, by Ultrasound presenting for routine prenatal visit  Plan   THIRD Problems (from 10/20/19 to present)    Problem Noted Resolved   Supervision of other normal pregnancy, antepartum 10/20/2019 by Mirna Mires, CNM No   Overview  Addendum 01/06/2020  9:48 AM by Zipporah Plants, CNM     Clinic  [facility] Prenatal Labs  Dating EDD by 9w u/s Blood type: A/Positive/-- (10/25 0847) A+  Genetic Screen  NIPS: neg x3 - xy Antibody:Negative (10/25 0847)negative  Anatomic [place]  01/06/20 wnl Rubella: 15.30 (10/25 0847)immune  GTT Early:  NA             Third trimester:  RPR: Non Reactive (10/25 0847) NR  Flu vaccine  HBsAg: Negative (10/25 0847) negative  TDaP vaccine                                               Rhogam: n/a HIV: Non Reactive (10/25 0847) NR  Baby Food                                               GBS: (For PCN allergy, check sensitivities)  Contraception  NID:[id number]  Circumcision    Pediatrician    Support Person              Previous Version       -Reviewed anatomy US - wnl  Gestational age appropriate obstetric precautions including but not limited to vaginal bleeding, contractions, leaking of fluid and fetal movement were reviewed in detail with the patient.    Return in about 4 weeks (around 02/03/2020) for 4-5 weeks for ROB.  Zipporah Plants, CNM, MSN [facility], [facility] 01/06/2020, 9:48 AM

## 2020-01-10 NOTE — L&D Delivery Note (Signed)
Vaginal Delivery Note  Spontaneous delivery of live viable female infant from the ROA position through an intact perineum. Delivery of anterior left shoulder with gentle downward guidance followed by delivery of the right posterior shoulder with gentle upward guidance. Body followed spontaneously. Infant placed on maternal chest. Nursery present and helped with neonatal resuscitation and evaluation. Cord clamped and cut after one minute. Cord blood not collected. Placenta delivered spontaneously and intact with a 3 vessel cord.  2nd degree laceration. Uterus firm and below umbilicus at the end of the delivery.  Mom and baby recovering in stable condition. Sponge and needle counts were correct at the end of the delivery.  APGARS: 1 minute:8 5 minutes: 9 Weight: pending Epidural present  Adelene Idler MD [facility], [facility] 05/22/20 2:29 PM

## 2020-01-15 NOTE — Telephone Encounter (Signed)
Duplicate message. 

## 2020-02-03 ENCOUNTER — Ambulatory Visit (INDEPENDENT_AMBULATORY_CARE_PROVIDER_SITE_OTHER): Payer: Medicaid Other | Admitting: Obstetrics and Gynecology

## 2020-02-03 ENCOUNTER — Other Ambulatory Visit: Payer: Self-pay

## 2020-02-03 VITALS — BP 100/62 | Wt 148.0 lb

## 2020-02-03 DIAGNOSIS — Z3A23 23 weeks gestation of pregnancy: Secondary | ICD-10-CM

## 2020-02-03 NOTE — Progress Notes (Signed)
Routine Prenatal Care Visit  Subjective  Kelly Burch is a 25 y.o. G2P1001 at [redacted]w[redacted]d being seen today for ongoing prenatal care.  She is currently monitored for the following issues for this low-risk pregnancy and has Supervision of other normal pregnancy, antepartum and COVID-19 virus vaccination declined on their problem list.  ----------------------------------------------------------------------------------- Patient reports difficultly sleeping. Patient feels it may be related to positioning or difficulty getting comfortable..    Lockie Pares. Bleeding: None.  Movement: Present. Denies leaking of fluid.  ----------------------------------------------------------------------------------- The following portions of the patient's history were reviewed and updated as appropriate: allergies, current medications, past family history, past medical history, past social history, past surgical history and problem list. Problem list updated.   Objective  Blood pressure 100/62, weight 148 lb (67.1 kg), last menstrual period 08/24/2019. Pregravid weight 122 lb (55.3 kg) Total Weight Gain 26 lb (11.8 kg) Urinalysis:      Fetal Status: Fetal Heart Rate (bpm): 135   Movement: Present     General:  Alert, oriented and cooperative. Patient is in no acute distress.  Skin: Skin is warm and dry. No rash noted.   Cardiovascular: Normal heart rate noted  Respiratory: Normal respiratory effort, no problems with respiration noted  Abdomen: Soft, gravid, appropriate for gestational age. Pain/Pressure: Absent     Pelvic:  Cervical exam deferred        Extremities: Normal range of motion.  Edema: None  ental Status: Normal mood and affect. Normal behavior. Normal judgment and thought content.     Assessment   25 y.o. G2P1001 at [redacted]w[redacted]d by  05/29/2020, by Ultrasound presenting for routine prenatal visit  Plan   THIRD Problems (from 10/20/19 to present)    Problem Noted Resolved   Supervision of other  normal pregnancy, antepartum 10/20/2019 by Mirna Mires, CNM No   Overview Addendum 01/06/2020  9:48 AM by Zipporah Plants, CNM     Clinic  [facility] Prenatal Labs  Dating EDD by 9w u/s Blood type: A/Positive/-- (10/25 0847) A+  Genetic Screen  NIPS: neg x3 - xy Antibody:Negative (10/25 0847)negative  Anatomic [place]  01/06/20 wnl Rubella: 15.30 (10/25 0847)immune  GTT Early:  NA             Third trimester:  RPR: Non Reactive (10/25 0847) NR  Flu vaccine  HBsAg: Negative (10/25 0847) negative  TDaP vaccine                                               Rhogam: n/a HIV: Non Reactive (10/25 0847) NR  Baby Food                                               GBS: (For PCN allergy, check sensitivities)  Contraception  QIW:LNLG  Circumcision    Pediatrician    Support Person              Previous Version      -Reviewed positioning in pregnancy for sleep, discussed Unisom as OTC option for sleep aid  Second trimester precautions including but not limited to vaginal bleeding, contractions, leaking of fluid and fetal movement were reviewed in detail with the patient.    Return in about 4 weeks (  around 03/02/2020) for ROB with 1h GTT.  Zipporah Plants, CNM, MSN [facility], [facility] 02/03/2020, 10:05 AM

## 2020-03-02 ENCOUNTER — Other Ambulatory Visit: Payer: Self-pay

## 2020-03-02 ENCOUNTER — Ambulatory Visit (INDEPENDENT_AMBULATORY_CARE_PROVIDER_SITE_OTHER): Payer: Medicaid Other | Admitting: Obstetrics & Gynecology

## 2020-03-02 ENCOUNTER — Encounter: Payer: Self-pay | Admitting: Obstetrics & Gynecology

## 2020-03-02 ENCOUNTER — Other Ambulatory Visit: Payer: Medicaid Other

## 2020-03-02 VITALS — BP 120/80 | Wt 152.0 lb

## 2020-03-02 DIAGNOSIS — Z131 Encounter for screening for diabetes mellitus: Secondary | ICD-10-CM

## 2020-03-02 DIAGNOSIS — Z3480 Encounter for supervision of other normal pregnancy, unspecified trimester: Secondary | ICD-10-CM

## 2020-03-02 DIAGNOSIS — Z3A27 27 weeks gestation of pregnancy: Secondary | ICD-10-CM

## 2020-03-02 NOTE — Patient Instructions (Signed)

## 2020-03-02 NOTE — Progress Notes (Signed)
  Subjective  Fetal Movement? yes Contractions? no Leaking Fluid? no Vaginal Bleeding? no  Objective  BP 120/80   Wt 152 lb (68.9 kg)   LMP 08/24/2019 (Within Weeks)   BMI 26.93 kg/m  General: NAD Pumonary: no increased work of breathing Abdomen: gravid, non-tender Extremities: no edema Psychiatric: mood appropriate, affect full  Assessment  25 y.o. G2P1001 at [redacted]w[redacted]d by  05/29/2020, by Ultrasound presenting for routine prenatal visit  Plan   Problem List Items Addressed This Visit      Other   Supervision of other normal pregnancy, antepartum - Primary    Other Visit Diagnoses    [redacted] weeks gestation of pregnancy       Relevant Orders   28 Week RH+Panel   Screening for diabetes mellitus       Relevant Orders   28 Week RH+Panel      THIRD Problems (from 10/20/19 to present)    Problem Noted Resolved   Supervision of other normal pregnancy, antepartum 10/20/2019 by Mirna Mires, CNM No   Overview Addendum 03/02/2020  9:05 AM by Nadara Mustard, MD     Clinic  [facility] Prenatal Labs  Dating EDD by 9w u/s Blood type: A/Positive/-- (10/25 0847) A+  Genetic Screen  NIPS: neg x3 - xy Antibody:Negative (10/25 0847)negative  Anatomic [place]  01/06/20 wnl Rubella: 15.30 (10/25 0847)immune  GTT Early:  NA             Third trimester:  RPR: Non Reactive (10/25 0847) NR  Flu vaccine  HBsAg: Negative (10/25 0847) negative  TDaP vaccine                                               Rhogam: n/a HIV: Non Reactive (10/25 0847) NR  Baby Food      Breast                                         GBS: (For PCN allergy, check sensitivities)  Contraception      POP QPR:FFMB  Circumcision    Pediatrician    Support Person Husband        Discussed breast feeding plans (desires to try, did not do weel w breast feeding last pregnancy 7 years ago; counseled as to options and support w lactation)  Discussed pp contraception, plans POP then OCP   Glucola today  Annamarie Major, MD,  [facility], [facility] 03/02/2020  9:05 AM

## 2020-03-03 LAB — 28 WEEK RH+PANEL
Basophils Absolute: 0 10*3/uL (ref 0.0–0.2)
Basos: 0 %
EOS (ABSOLUTE): 0.3 10*3/uL (ref 0.0–0.4)
Eos: 3 %
Gestational Diabetes Screen: 98 mg/dL (ref 65–139)
HIV Screen 4th Generation wRfx: NONREACTIVE
Hematocrit: 34.6 % (ref 34.0–46.6)
Hemoglobin: 12 g/dL (ref 11.1–15.9)
Immature Grans (Abs): 0.1 10*3/uL (ref 0.0–0.1)
Immature Granulocytes: 1 %
Lymphocytes Absolute: 1.4 10*3/uL (ref 0.7–3.1)
Lymphs: 14 %
MCH: 32.2 pg (ref 26.6–33.0)
MCHC: 34.7 g/dL (ref 31.5–35.7)
MCV: 93 fL (ref 79–97)
Monocytes Absolute: 0.5 10*3/uL (ref 0.1–0.9)
Monocytes: 5 %
Neutrophils Absolute: 7.5 10*3/uL — ABNORMAL HIGH (ref 1.4–7.0)
Neutrophils: 77 %
Platelets: 205 10*3/uL (ref 150–450)
RBC: 3.73 x10E6/uL — ABNORMAL LOW (ref 3.77–5.28)
RDW: 12 % (ref 11.7–15.4)
RPR Ser Ql: NONREACTIVE
WBC: 9.8 10*3/uL (ref 3.4–10.8)

## 2020-03-08 ENCOUNTER — Observation Stay
Admission: EM | Admit: 2020-03-08 | Discharge: 2020-03-08 | Disposition: A | Discharge status: Home / Self Care | Payer: BC Managed Care – PPO | Attending: Obstetrics & Gynecology | Admitting: Obstetrics & Gynecology

## 2020-03-08 ENCOUNTER — Encounter: Payer: Self-pay | Admitting: Obstetrics & Gynecology

## 2020-03-08 ENCOUNTER — Other Ambulatory Visit: Payer: Self-pay

## 2020-03-08 DIAGNOSIS — R197 Diarrhea, unspecified: Secondary | ICD-10-CM | POA: Insufficient documentation

## 2020-03-08 DIAGNOSIS — Z3A28 28 weeks gestation of pregnancy: Secondary | ICD-10-CM | POA: Insufficient documentation

## 2020-03-08 DIAGNOSIS — M549 Dorsalgia, unspecified: Secondary | ICD-10-CM | POA: Diagnosis not present

## 2020-03-08 DIAGNOSIS — O99891 Other specified diseases and conditions complicating pregnancy: Secondary | ICD-10-CM | POA: Diagnosis not present

## 2020-03-08 DIAGNOSIS — O26893 Other specified pregnancy related conditions, third trimester: Principal | ICD-10-CM | POA: Insufficient documentation

## 2020-03-08 DIAGNOSIS — O26899 Other specified pregnancy related conditions, unspecified trimester: Secondary | ICD-10-CM

## 2020-03-08 DIAGNOSIS — H538 Other visual disturbances: Secondary | ICD-10-CM | POA: Diagnosis not present

## 2020-03-08 DIAGNOSIS — Z3480 Encounter for supervision of other normal pregnancy, unspecified trimester: Secondary | ICD-10-CM

## 2020-03-08 NOTE — Discharge Summary (Signed)
Physician Final Progress Note  Patient ID: Kelly Burch MRN: [id number] DOB/AGE: 1995/10/24 24 y.o.  Admit date: 03/08/2020 Admitting provider: Tresea Mall, CNM Discharge date: 03/08/2020   Admission Diagnoses: diarrhea, backache, dizzy  Discharge Diagnoses:  Active Problems:   Labor and delivery, indication for care   Back pain affecting pregnancy in third trimester   Diarrhea during pregnancy   [redacted] weeks gestation of pregnancy   History of Present Illness: The patient is a 25 y.o. female G2P1001 at [redacted]w[redacted]d who presents for diarrhea and back pain since last night. She has had blurry vision today with a feeling of dizziness and felt that she might black out. She did not black out. She denies fever, congestion, nausea, vomiting. She denies urinary or vaginal symptoms. She reports drinking 3 large bottles of water today. She reports good fetal movement. She denies vaginal bleeding or leakage of fluid. She is able to go home and rest and work from home tomorrow. She is advised to stay well hydrated. Monitoring is reassuring. She is discharged to home with instructions and precautions.    Past Medical History:  Diagnosis Date  . Medical history non-contributory     Past Surgical History:  Procedure Laterality Date  . EYE SURGERY    . 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 NO PAST SURGERIES    . TONSILLECTOMY      No current facility-administered medications on file prior to encounter.   Current Outpatient Medications on File Prior to Encounter  Medication Sig Dispense Refill  . Prenatal Vit-Fe Fumarate-FA (PRENATAL MULTIVITAMIN) TABS tablet Take 1 tablet by mouth at bedtime.      Allergies  Allergen Reactions  . Iodides Anxiety, Hives, Itching and Palpitations    After injection of IV contrast for CT, pt began having palpitations, tachycardia, would not speak, after rapid response team arrived rash and hives with itching began.  Transferred to ED.  . Iodine Anxiety, Itching, Rash  and Palpitations    After injection of IV contrast for CT, pt began having palpitations, tachycardia, would not speak, after rapid response team arrived rash and hives with itching began. Transferred to ED. After injection of IV contrast for CT, pt began having palpitations, tachycardia, would not speak, after rapid response team arrived rash and hives with itching began. Transferred to ED. After injection of IV contrast for CT, pt began having palpitations, tachycardia, would not speak, after rapid response team arrived rash and hives with itching began. Transferred to ED.    Social History   Socioeconomic History  . Marital status: Single    Spouse name: Not on file  . Number of children: Not on file  . Years of education: Not on file  . Highest education level: Not on file  Occupational History  . Not on file  Tobacco Use  . Smoking status: Former Smoker    Packs/day: 0.50    Years: 2.00    Pack years: 1.00    Types: Cigarettes    Quit date: 2019    Years since quitting: 3.1  . Smokeless tobacco: Never Used  Vaping Use  . Vaping Use: Never used  Substance and Sexual Activity  . Alcohol use: No  . Drug use: No  . Sexual activity: Yes    Birth control/protection: Pill  Other Topics Concern  . Not on file  Social History Narrative  . Not on file   Social Determinants of Health   Financial Resource Strain: Not on file  Food Insecurity:  Not on file  Transportation Needs: Not on file  Physical Activity: Not on file  Stress: Not on file  Social Connections: Not on file  Intimate Partner Violence: Not on file    Family History  Problem Relation Age of Onset  . Asthma Mother   . Hypertension Father   . Cancer Maternal Grandfather   . Diabetes Maternal Grandfather   . Cancer Paternal Grandfather   . Diabetes Son 6       type I  . Alcohol abuse Neg Hx   . Arthritis Neg Hx   . Birth defects Neg Hx   . COPD Neg Hx   . Depression Neg Hx   . Early death Neg Hx    . Hearing loss Neg Hx   . Heart disease Neg Hx   . Hyperlipidemia Neg Hx   . Kidney disease Neg Hx   . Learning disabilities Neg Hx   . Mental illness Neg Hx   . Mental retardation Neg Hx   . Miscarriages / Stillbirths Neg Hx   . Stroke Neg Hx   . Vision loss Neg Hx      Review of Systems  Constitutional: Negative for chills and fever.  HENT: Negative for congestion, ear discharge, ear pain, hearing loss, sinus pain and sore throat.   Eyes: Negative for blurred vision and double vision.  Respiratory: Negative for cough, shortness of breath and wheezing.   Cardiovascular: Negative for chest pain, palpitations and leg swelling.  Gastrointestinal: Positive for diarrhea. Negative for abdominal pain, blood in stool, constipation, heartburn, melena, nausea and vomiting.  Genitourinary: Negative for dysuria, flank pain, frequency, hematuria and urgency.  Musculoskeletal: Positive for back pain. Negative for joint pain and myalgias.  Skin: Negative for itching and rash.  Neurological: Positive for dizziness. Negative for tingling, tremors, sensory change, speech change, focal weakness, seizures, loss of consciousness, weakness and headaches.  Endo/Heme/Allergies: Negative for environmental allergies. Does not bruise/bleed easily.  Psychiatric/Behavioral: Negative for depression, hallucinations, memory loss, substance abuse and suicidal ideas. The patient is not nervous/anxious and does not have insomnia.      Physical Exam: BP 122/69 (BP Location: Right Arm)   Pulse (!) 105   Temp 98.6 F (37 C) (Oral)   Resp 18   Ht 5\' 3"  (1.6 m)   Wt 68.9 kg   LMP 08/24/2019 (Within Weeks)   BMI 26.93 kg/m   Constitutional: Well nourished, well developed female in no acute distress.  HEENT: normal Skin: Warm and dry.  Cardiovascular: Regular rate and rhythm.   Extremity: no edema  Respiratory: Clear to auscultation bilateral. Normal respiratory effort Abdomen: FHT present Back: no  CVAT Neuro: DTRs 2+, Cranial nerves grossly intact Psych: Alert and Oriented x3. No memory deficits. Normal mood and affect.   Toco: negative for contractions Fetal well being: 125 bpm, moderate variability, + accelerations 15x15, -decelerations  Consults: None  Significant Findings/ Diagnostic Studies: none  Procedures: NST  Hospital Course: The patient was admitted to Labor and Delivery Triage for observation.   Discharge Condition: good  Disposition: Discharge disposition: 01-Home or Self Care  Diet: Regular diet, stay well hydrated  Discharge Activity: Activity as tolerated  Discharge Instructions    Discharge activity:  No Restrictions   Complete by: As directed    Discharge diet:  No restrictions   Complete by: As directed    Stay well hydrated   No sexual activity restrictions   Complete by: As directed    Notify physician for  a general feeling that "something is not right"   Complete by: As directed    Notify physician for increase or change in vaginal discharge   Complete by: As directed    Notify physician for intestinal cramps, with or without diarrhea, sometimes described as "gas pain"   Complete by: As directed    Notify physician for leaking of fluid   Complete by: As directed    Notify physician for low, dull backache, unrelieved by heat or Tylenol   Complete by: As directed    Notify physician for menstrual like cramps   Complete by: As directed    Notify physician for pelvic pressure   Complete by: As directed    Notify physician for uterine contractions.  These may be painless and feel like the uterus is tightening or the baby is  "balling up"   Complete by: As directed    Notify physician for vaginal bleeding   Complete by: As directed    PRETERM LABOR:  Includes any of the follwing symptoms that occur between 20 - [redacted] weeks gestation.  If these symptoms are not stopped, preterm labor can result in preterm delivery, placing your baby at risk    Complete by: As directed      Allergies as of 03/08/2020      Reactions   Iodides Anxiety, Hives, Itching, Palpitations   After injection of IV contrast for CT, pt began having palpitations, tachycardia, would not speak, after rapid response team arrived rash and hives with itching began.  Transferred to ED.   Iodine Anxiety, Itching, Rash, Palpitations   After injection of IV contrast for CT, pt began having palpitations, tachycardia, would not speak, after rapid response team arrived rash and hives with itching began. Transferred to ED. After injection of IV contrast for CT, pt began having palpitations, tachycardia, would not speak, after rapid response team arrived rash and hives with itching began. Transferred to ED. After injection of IV contrast for CT, pt began having palpitations, tachycardia, would not speak, after rapid response team arrived rash and hives with itching began. Transferred to ED.      Medication List    TAKE these medications   prenatal multivitamin Tabs tablet Take 1 tablet by mouth at bedtime.       Follow-up Information    [facility]. Go to.   Specialty: Obstetrics and Gynecology Why: scheduled prenatal appointment Contact information: [address] [place] [address] [phone]              Total time spent taking care of this patient: 20 minutes  Signed: Tresea Mall, CNM  03/08/2020, 6:47 PM

## 2020-03-08 NOTE — OB Triage Note (Signed)
Patient G2P1 [redacted]w[redacted]d presents to L&D with complaints of back pain, pelvic pain and diarrhea. She reports having diarrhea starting yesterday at 1730 and has had 5 occurrences since then. She reports the first two diarrhea being watery and the last two more of a loose stool. She reports the last thing she ate before this was chicken nuggets and fries. She reports not being around anyone that has been sick. She reports having COVID in the beginning of pregnancy. She reports back and pelvic pain that started after the diarrhea.She states the pain is worse when she is sitting up or laying on her side. She states the pain is better than it was and rates it a 1/10 right now. She reports it being achy. She reports positive fetal movement and denies contractions, leaking of fluid and vaginal bleeding. She states she has been able to keep fluid and drinks down. She reports having 3 large bottles of water today. Vital signs are stable. Monitors applied and assessing.

## 2020-03-08 NOTE — OB Triage Note (Signed)
CNM and RN at bedside to go over discharge instructions. Patient verbalized understanding and all questions answered.

## 2020-03-08 NOTE — Telephone Encounter (Signed)
Please advise 

## 2020-03-16 ENCOUNTER — Other Ambulatory Visit: Payer: Self-pay

## 2020-03-16 ENCOUNTER — Ambulatory Visit (INDEPENDENT_AMBULATORY_CARE_PROVIDER_SITE_OTHER): Payer: BC Managed Care – PPO | Admitting: Obstetrics and Gynecology

## 2020-03-16 VITALS — BP 118/62 | Wt 156.1 lb

## 2020-03-16 DIAGNOSIS — Z3A29 29 weeks gestation of pregnancy: Secondary | ICD-10-CM

## 2020-03-16 DIAGNOSIS — Z3480 Encounter for supervision of other normal pregnancy, unspecified trimester: Secondary | ICD-10-CM

## 2020-03-16 DIAGNOSIS — Z23 Encounter for immunization: Secondary | ICD-10-CM

## 2020-03-16 LAB — POCT URINALYSIS DIPSTICK OB
Glucose, UA: NEGATIVE
POC,PROTEIN,UA: NEGATIVE

## 2020-03-16 NOTE — Progress Notes (Signed)
Routine Prenatal Care Visit  Subjective  Kelly Burch is a 25 y.o. G2P1001 at [redacted]w[redacted]d being seen today for ongoing prenatal care.  She is currently monitored for the following issues for this low-risk pregnancy and has Supervision of other normal pregnancy, antepartum; COVID-19 virus vaccination declined; Back pain affecting pregnancy in third trimester; and Diarrhea during pregnancy on their problem list.  ----------------------------------------------------------------------------------- Patient reports no complaints.  Patient requesting work note to help transition role to working from home for close access to care - current commute is >1 hour. Contractions: Not present. Vag. Bleeding: None.  Movement: Present. Denies leaking of fluid.  ----------------------------------------------------------------------------------- The following portions of the patient's history were reviewed and updated as appropriate: allergies, current medications, past family history, past medical history, past social history, past surgical history and problem list. Problem list updated.   Objective  Blood pressure 118/62, weight 156 lb 2 oz (70.8 kg), last menstrual period 08/24/2019. Pregravid weight 122 lb (55.3 kg) Total Weight Gain 34 lb 2 oz (15.5 kg) Urinalysis:      Fetal Status: Fetal Heart Rate (bpm): 130 Fundal Height: 29 cm Movement: Present     General:  Alert, oriented and cooperative. Patient is in no acute distress.  Skin: Skin is warm and dry. No rash noted.   Cardiovascular: Normal heart rate noted  Respiratory: Normal respiratory effort, no problems with respiration noted  Abdomen: Soft, gravid, appropriate for gestational age. Pain/Pressure: Absent     Pelvic:  Cervical exam deferred        Extremities: Normal range of motion.  Edema: None  ental Status: Normal mood and affect. Normal behavior. Normal judgment and thought content.     Assessment   25 y.o. G2P1001 at [redacted]w[redacted]d by   05/29/2020, by Ultrasound presenting for routine prenatal visit  Plan   THIRD Problems (from 10/20/19 to present)    Problem Noted Resolved   Back pain affecting pregnancy in third trimester 03/08/2020 by Tresea Mall, CNM No   Supervision of other normal pregnancy, antepartum 10/20/2019 by Mirna Mires, CNM No   Overview Addendum 03/16/2020 10:26 AM by Zipporah Plants, CNM      Nursing Staff Provider  Office Location  [facility] Dating   LMP c/w 9w [place]  Language  English Anatomy US   complete, normal  Flu Vaccine   declined Genetic Screen  NIPS:   Negx3, xy  TDaP vaccine   03/16/20 Hgb A1C or  GTT Early : n/a Third trimester : 98  Rhogam   n/a   LAB RESULTS   Feeding Plan  breast Blood Type A/Positive/-- (10/25 0847)   Contraception  POP Antibody Negative (10/25 0847)  Circumcision  Rubella 15.30 (10/25 0847)  Pediatrician   RPR Non Reactive (02/22 0933)   Support Person  husband HBsAg Negative (10/25 0847)   Prenatal Classes  declined HIV Non Reactive (02/22 0933)    Varicella  immune  BTL Consent  n/a GBS  (For PCN allergy, check sensitivities)        VBAC Consent  n/a Pap  10/20/19 - NILM    Hgb Electro      CF      SMA               Previous Version      -Work note provided. -Tdap today. -Reviewed third trimester labs with patient.  Preterm labor precautions including but not limited to vaginal bleeding, contractions, leaking of fluid and fetal movement were reviewed in  detail with the patient.    Return in about 2 weeks (around 03/30/2020) for ROB.  Zipporah Plants, CNM, MSN [facility], [facility] 03/16/2020, 10:28 AM

## 2020-03-30 ENCOUNTER — Encounter: Payer: Self-pay | Admitting: Obstetrics and Gynecology

## 2020-03-30 ENCOUNTER — Other Ambulatory Visit: Payer: Self-pay

## 2020-03-30 ENCOUNTER — Ambulatory Visit (INDEPENDENT_AMBULATORY_CARE_PROVIDER_SITE_OTHER): Payer: Medicaid Other | Admitting: Obstetrics and Gynecology

## 2020-03-30 VITALS — BP 108/70 | Ht 63.0 in | Wt 153.0 lb

## 2020-03-30 DIAGNOSIS — Z3483 Encounter for supervision of other normal pregnancy, third trimester: Secondary | ICD-10-CM

## 2020-03-30 DIAGNOSIS — Z3A31 31 weeks gestation of pregnancy: Secondary | ICD-10-CM

## 2020-03-30 LAB — POCT URINALYSIS DIPSTICK OB
Glucose, UA: NEGATIVE
POC,PROTEIN,UA: NEGATIVE

## 2020-03-30 NOTE — Patient Instructions (Signed)

## 2020-03-30 NOTE — Progress Notes (Signed)
Routine Prenatal Care Visit  Subjective  Kelly Burch is a 25 y.o. G2P1001 at [redacted]w[redacted]d being seen today for ongoing prenatal care.  She is currently monitored for the following issues for this low-risk pregnancy and has Supervision of other normal pregnancy, antepartum; COVID-19 virus vaccination declined; Back pain affecting pregnancy in third trimester; and Diarrhea during pregnancy on their problem list.  ----------------------------------------------------------------------------------- Patient reports no complaints.   Contractions: Not present. Vag. Bleeding: None.  Movement: Present. Denies leaking of fluid.  ----------------------------------------------------------------------------------- The following portions of the patient's history were reviewed and updated as appropriate: allergies, current medications, past family history, past medical history, past social history, past surgical history and problem list. Problem list updated.   Objective  Blood pressure 108/70, height 5\' 3"  (1.6 m), weight 153 lb (69.4 kg), last menstrual period 08/24/2019. Pregravid weight 122 lb (55.3 kg) Total Weight Gain 31 lb (14.1 kg) Urinalysis:      Fetal Status: Fetal Heart Rate (bpm): 125 Fundal Height: 32 cm Movement: Present     General:  Alert, oriented and cooperative. Patient is in no acute distress.  Skin: Skin is warm and dry. No rash noted.   Cardiovascular: Normal heart rate noted  Respiratory: Normal respiratory effort, no problems with respiration noted  Abdomen: Soft, gravid, appropriate for gestational age. Pain/Pressure: Absent     Pelvic:  Cervical exam deferred        Extremities: Normal range of motion.  Edema: None  Mental Status: Normal mood and affect. Normal behavior. Normal judgment and thought content.     Assessment   25 y.o. G2P1001 at [redacted]w[redacted]d by  05/29/2020, by Ultrasound presenting for routine prenatal visit  Plan   THIRD Problems (from 10/20/19 to present)     Problem Noted Resolved   Back pain affecting pregnancy in third trimester 03/08/2020 by 03/10/2020, CNM No   Supervision of other normal pregnancy, antepartum 10/20/2019 by 12/20/2019, CNM No   Overview Addendum 03/30/2020  8:39 AM by 04/01/2020, MD      Nursing Staff Provider  Office Location  [facility] Dating   LMP c/w 9w Natale Milch  Language  English Anatomy US   complete, normal  Flu Vaccine   declined Genetic Screen  NIPS:   Negx3, xy  TDaP vaccine   03/16/20 Hgb A1C or  GTT Early : n/a Third trimester : 98  Rhogam   n/a   LAB RESULTS   Feeding Plan  breast Blood Type A/Positive/-- (10/25 0847)   Contraception  POP Antibody Negative (10/25 0847)  Circumcision  Rubella 15.30 (10/25 0847)  Pediatrician   RPR Non Reactive (02/22 0933)   Support Person  husband HBsAg Negative (10/25 0847)   Prenatal Classes  declined HIV Non Reactive (02/22 0933)    Varicella  immune  BTL Consent  desires Consents 03/30/2020 GBS  (For PCN allergy, check sensitivities)        VBAC Consent  n/a Pap  10/20/19 - NILM    Hgb Electro      CF      SMA               Previous Version       Patient reports her last delivery was very traumatic for her. It occurred 7 years ago. She had a vacuum delivery and "the baby was stuck in the birth canal." Last baby was 9lbs 4 oz even though her growth 12/20/19 were predicting a 7 lb baby.  She  is very anxious about having a second vaginal delivery and has a strong preference to have a cesarean delivery.  She would like to have an elective repeat cesarean delivery.  She is also interested in a tubal ligation.  Signed tubal consents today.  She desires delivery on May 17th as her preferred delivery date.   Gestational age appropriate obstetric precautions including but not limited to vaginal bleeding, contractions, leaking of fluid and fetal movement were reviewed in detail with the patient.    Return in about 2 weeks (around 04/13/2020) for ROB in  person.  Natale Milch MD [facility], [facility] 03/30/2020, 8:40 AM

## 2020-03-30 NOTE — Telephone Encounter (Signed)
Sounds like she would like to move now to the 19 th of May instead of the 17 th

## 2020-04-05 ENCOUNTER — Telehealth: Payer: Self-pay

## 2020-04-05 NOTE — Telephone Encounter (Signed)
Messaged through My Chart patient to schedule c-section w [facility]  DOS 5/19  H&P 5/12 @ 8:10 AM   Covid testing 5/18 @ 9:00AM, [facility]. Advised pt to quarantine until DOS.  Pre-admit phone call appointment to be requested - date and time will be included on H&P paper work. Also all appointments will be updated on pt MyChart. Based on the time scheduled will indicate if the call will be received within a 4 hour window before 1:00 or after.  Advised that pt may also receive calls from the hospital pharmacy and pre-service center.  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

## 2020-04-05 NOTE — Telephone Encounter (Signed)
-----   Message from Natale Milch, MD sent at 03/30/2020  8:52 AM EDT ----- Surgery Booking Request Patient Full Name:  Kelly Burch  MRN: [id number]  DOB: [birth year]  Surgeon: Vena Austria MD Requested Surgery Date and Time: 05/25/2020 Primary Diagnosis AND Code: Primary elective cesarean , desires sterilization Secondary Diagnosis and Code:  Surgical Procedure: Cesarean section with tubal ligation RNFA Requested?: No L&D Notification: Yes Admission Status: surgery admit Length of Surgery: 75 min Special Case Needs: No H&P: Yes Phone Interview???:  Yes Interpreter: No Medical Clearance:  No Special Scheduling Instructions: No Any known health/anesthesia issues, diabetes, sleep apnea, latex allergy, defibrillator/pacemaker?: No Acuity: P2   (P1 highest, P2 delay may cause harm, P3 low, elective gyn, P4 lowest)

## 2020-04-05 NOTE — Telephone Encounter (Addendum)
error 

## 2020-04-13 ENCOUNTER — Other Ambulatory Visit: Payer: Self-pay

## 2020-04-13 ENCOUNTER — Ambulatory Visit (INDEPENDENT_AMBULATORY_CARE_PROVIDER_SITE_OTHER): Payer: Medicaid Other | Admitting: Obstetrics

## 2020-04-13 VITALS — BP 122/70 | Wt 161.0 lb

## 2020-04-13 DIAGNOSIS — Z3A33 33 weeks gestation of pregnancy: Secondary | ICD-10-CM

## 2020-04-13 DIAGNOSIS — Z3483 Encounter for supervision of other normal pregnancy, third trimester: Secondary | ICD-10-CM

## 2020-04-13 NOTE — Progress Notes (Signed)
No vb. No lof.  

## 2020-04-13 NOTE — Progress Notes (Signed)
Routine Prenatal Care Visit  Subjective  Kelly Burch is a 25 y.o. G2P1001 at [redacted]w[redacted]d being seen today for ongoing prenatal care.  She is currently monitored for the following issues for this low-risk pregnancy and has Supervision of other normal pregnancy, antepartum; COVID-19 virus vaccination declined; Back pain affecting pregnancy in third trimester; and Diarrhea during pregnancy on their problem list.  ----------------------------------------------------------------------------------- Patient reports no complaints.  Planning on a primary CS in May. Also having a BTL. Had a difficult first delivery of a 9 lb baby with a PP bleed. Contractions: Not present. Vag. Bleeding: None.   . Leaking Fluid denies.  ----------------------------------------------------------------------------------- The following portions of the patient's history were reviewed and updated as appropriate: allergies, current medications, past family history, past medical history, past social history, past surgical history and problem list. Problem list updated.  Objective  Blood pressure 122/70, weight 161 lb (73 kg), last menstrual period 08/24/2019. Pregravid weight 122 lb (55.3 kg) Total Weight Gain 39 lb (17.7 kg) Urinalysis: Urine Protein    Urine Glucose    Fetal Status:           General:  Alert, oriented and cooperative. Patient is in no acute distress.  Skin: Skin is warm and dry. No rash noted.   Cardiovascular: Normal heart rate noted  Respiratory: Normal respiratory effort, no problems with respiration noted  Abdomen: Soft, gravid, appropriate for gestational age.       Pelvic:  Cervical exam deferred        Extremities: Normal range of motion.     Mental Status: Normal mood and affect. Normal behavior. Normal judgment and thought content.   Assessment   25 y.o. G2P1001 at [redacted]w[redacted]d by  05/29/2020, by Ultrasound presenting for routine prenatal visit  Plan   THIRD Problems (from 10/20/19 to present)     Problem Noted Resolved   Back pain affecting pregnancy in third trimester 03/08/2020 by Tresea Mall, CNM No   Supervision of other normal pregnancy, antepartum 10/20/2019 by Mirna Mires, CNM No   Overview Addendum 03/30/2020  8:39 AM by Natale Milch, MD      Nursing Staff Provider  Office Location  [facility] Dating   LMP c/w 9w [place]  Language  English Anatomy US   complete, normal  Flu Vaccine   declined Genetic Screen  NIPS:   Negx3, xy  TDaP vaccine   03/16/20 Hgb A1C or  GTT Early : n/a Third trimester : 98  Rhogam   n/a   LAB RESULTS   Feeding Plan  breast Blood Type A/Positive/-- (10/25 0847)   Contraception  POP Antibody Negative (10/25 0847)  Circumcision  Rubella 15.30 (10/25 0847)  Pediatrician   RPR Non Reactive (02/22 0933)   Support Person  husband HBsAg Negative (10/25 0847)   Prenatal Classes  declined HIV Non Reactive (02/22 0933)    Varicella  immune  BTL Consent  desires Consents 03/30/2020 GBS  (For PCN allergy, check sensitivities)        VBAC Consent  n/a Pap  10/20/19 - NILM    Hgb Electro      CF      SMA               Previous Version       Preterm labor symptoms and general obstetric precautions including but not limited to vaginal bleeding, contractions, leaking of fluid and fetal movement were reviewed in detail with the patient. Please refer to After Visit Summary for  other counseling recommendations.   Return in about 2 weeks (around 04/27/2020) for return OB.  Mirna Mires, CNM  04/13/2020 8:35 AM

## 2020-04-14 ENCOUNTER — Ambulatory Visit: Payer: Medicaid Other | Admitting: Obstetrics and Gynecology

## 2020-04-14 ENCOUNTER — Ambulatory Visit (INDEPENDENT_AMBULATORY_CARE_PROVIDER_SITE_OTHER): Payer: Medicaid Other | Admitting: Obstetrics and Gynecology

## 2020-04-14 ENCOUNTER — Encounter: Payer: Self-pay | Admitting: Obstetrics and Gynecology

## 2020-04-14 VITALS — BP 106/60 | Wt 159.0 lb

## 2020-04-14 DIAGNOSIS — O26893 Other specified pregnancy related conditions, third trimester: Secondary | ICD-10-CM | POA: Diagnosis not present

## 2020-04-14 DIAGNOSIS — N898 Other specified noninflammatory disorders of vagina: Secondary | ICD-10-CM

## 2020-04-14 DIAGNOSIS — N760 Acute vaginitis: Secondary | ICD-10-CM

## 2020-04-14 DIAGNOSIS — Z3483 Encounter for supervision of other normal pregnancy, third trimester: Secondary | ICD-10-CM

## 2020-04-14 DIAGNOSIS — B9689 Other specified bacterial agents as the cause of diseases classified elsewhere: Secondary | ICD-10-CM

## 2020-04-14 DIAGNOSIS — Z3A33 33 weeks gestation of pregnancy: Secondary | ICD-10-CM

## 2020-04-14 LAB — POCT WET PREP (WET MOUNT)
Clue Cells Wet Prep Whiff POC: POSITIVE
Trichomonas Wet Prep HPF POC: ABSENT

## 2020-04-14 MED ORDER — METRONIDAZOLE 0.75 % VA GEL: 1.0000 | Freq: Every day | VAGINAL | 1 refills | Status: AC

## 2020-04-14 NOTE — Progress Notes (Signed)
Routine Prenatal Care Visit  Subjective  MAHEEN CWIKLA is a 25 y.o. G2P1001 at [redacted]w[redacted]d being seen today for ongoing prenatal care.  She is currently monitored for the following issues for this low-risk pregnancy and has Supervision of other normal pregnancy, antepartum; COVID-19 virus vaccination declined; Back pain affecting pregnancy in third trimester; and Diarrhea during pregnancy on their problem list.  ----------------------------------------------------------------------------------- Patient reports increased vaginal discharge over last week now with vaginal irratation over the last few days. She reports noting odor to the discharge..   Contractions: Not present. Vag. Bleeding: None.  Movement: Present. Denies leaking of fluid.  ----------------------------------------------------------------------------------- The following portions of the patient's history were reviewed and updated as appropriate: allergies, current medications, past family history, past medical history, past social history, past surgical history and problem list. Problem list updated.   Objective  Blood pressure 106/60, weight 159 lb (72.1 kg), last menstrual period 08/24/2019. Pregravid weight 122 lb (55.3 kg) Total Weight Gain 37 lb (16.8 kg) Urinalysis:      Fetal Status: Fetal Heart Rate (bpm): 135   Movement: Present     General:  Alert, oriented and cooperative. Patient is in no acute distress.  Skin: Skin is warm and dry. No rash noted.   Cardiovascular: Normal heart rate noted  Respiratory: Normal respiratory effort, no problems with respiration noted  Abdomen: Soft, gravid, appropriate for gestational age. Pain/Pressure: Absent     Pelvic:  Cervical exam deferred  External: Normal appearing vulva. No lesions noted.  Speculum examination: Normal appearing cervix. No blood in the vaginal vault. Adherent yellow discharge noted.          Extremities: Normal range of motion.     ental Status: Normal  mood and affect. Normal behavior. Normal judgment and thought content.   Microscopic wet-mount exam shows clue cells.   Assessment   25 y.o. G2P1001 at [redacted]w[redacted]d by  05/29/2020, by Ultrasound presenting for work-in prenatal visit  Plan   THIRD Problems (from 10/20/19 to present)    Problem Noted Resolved   Back pain affecting pregnancy in third trimester 03/08/2020 by Tresea Mall, CNM No   Supervision of other normal pregnancy, antepartum 10/20/2019 by Mirna Mires, CNM No   Overview Addendum 03/30/2020  8:39 AM by Natale Milch, MD      Nursing Staff Provider  Office Location  [facility] Dating   LMP c/w 9w [place]  Language  English Anatomy US   complete, normal  Flu Vaccine   declined Genetic Screen  NIPS:   Negx3, xy  TDaP vaccine   03/16/20 Hgb A1C or  GTT Early : n/a Third trimester : 98  Rhogam   n/a   LAB RESULTS   Feeding Plan  breast Blood Type A/Positive/-- (10/25 0847)   Contraception  POP Antibody Negative (10/25 0847)  Circumcision  Rubella 15.30 (10/25 0847)  Pediatrician   RPR Non Reactive (02/22 0933)   Support Person  husband HBsAg Negative (10/25 0847)   Prenatal Classes  declined HIV Non Reactive (02/22 0933)    Varicella  immune  BTL Consent  desires Consents 03/30/2020 GBS  (For PCN allergy, check sensitivities)        VBAC Consent  n/a Pap  10/20/19 - NILM    Hgb Electro      CF      SMA               Previous Version      -Metrogel for BV  Preterm labor precautions including but not limited to vaginal bleeding, contractions, leaking of fluid and fetal movement were reviewed in detail with the patient.    Keep previously scheduled ROB.  Zipporah Plants, CNM, MSN [facility], [facility] 04/14/2020, 4:55 PM

## 2020-04-16 ENCOUNTER — Ambulatory Visit: Payer: Medicaid Other | Admitting: Obstetrics

## 2020-04-22 ENCOUNTER — Emergency Department
Admission: EM | Admit: 2020-04-22 | Discharge: 2020-04-22 | Disposition: A | Discharge status: Home / Self Care | Payer: Medicaid Other | Attending: Emergency Medicine | Admitting: Emergency Medicine

## 2020-04-22 ENCOUNTER — Other Ambulatory Visit: Payer: Self-pay

## 2020-04-22 ENCOUNTER — Emergency Department: Payer: Medicaid Other

## 2020-04-22 DIAGNOSIS — O99413 Diseases of the circulatory system complicating pregnancy, third trimester: Secondary | ICD-10-CM | POA: Insufficient documentation

## 2020-04-22 DIAGNOSIS — O26893 Other specified pregnancy related conditions, third trimester: Secondary | ICD-10-CM | POA: Diagnosis not present

## 2020-04-22 DIAGNOSIS — Z3A34 34 weeks gestation of pregnancy: Secondary | ICD-10-CM | POA: Insufficient documentation

## 2020-04-22 DIAGNOSIS — R0789 Other chest pain: Secondary | ICD-10-CM

## 2020-04-22 DIAGNOSIS — Z87891 Personal history of nicotine dependence: Secondary | ICD-10-CM | POA: Diagnosis not present

## 2020-04-22 LAB — BASIC METABOLIC PANEL
Anion gap: 8 (ref 5–15)
BUN: 7 mg/dL (ref 6–20)
CO2: 20 mmol/L — ABNORMAL LOW (ref 22–32)
Calcium: 9.1 mg/dL (ref 8.9–10.3)
Chloride: 107 mmol/L (ref 98–111)
Creatinine, Ser: 0.48 mg/dL (ref 0.44–1.00)
GFR, Estimated: >60 mL/min (ref >60)
Glucose, Bld: 94 mg/dL (ref 70–99)
Potassium: 3.7 mmol/L (ref 3.5–5.1)
Sodium: 135 mmol/L (ref 135–145)

## 2020-04-22 LAB — CBC
HCT: 32.8 % — ABNORMAL LOW (ref 36.0–46.0)
Hemoglobin: 11.3 g/dL — ABNORMAL LOW (ref 12.0–15.0)
MCH: 31.3 pg (ref 26.0–34.0)
MCHC: 34.5 g/dL (ref 30.0–36.0)
MCV: 90.9 fL (ref 80.0–100.0)
Platelets: 184 10*3/uL (ref 150–400)
RBC: 3.61 MIL/uL — ABNORMAL LOW (ref 3.87–5.11)
RDW: 12.2 % (ref 11.5–15.5)
WBC: 12.8 10*3/uL — ABNORMAL HIGH (ref 4.0–10.5)
nRBC: 0 % (ref 0.0–0.2)

## 2020-04-22 LAB — HEPATIC FUNCTION PANEL
ALT: 11 U/L (ref 0–44)
AST: 21 U/L (ref 15–41)
Albumin: 3.1 g/dL — ABNORMAL LOW (ref 3.5–5.0)
Alkaline Phosphatase: 119 U/L (ref 38–126)
Bilirubin, Direct: <0.1 mg/dL (ref 0.0–0.2)
Total Bilirubin: 0.6 mg/dL (ref 0.3–1.2)
Total Protein: 6.2 g/dL — ABNORMAL LOW (ref 6.5–8.1)

## 2020-04-22 LAB — TROPONIN I (HIGH SENSITIVITY)
Troponin I (High Sensitivity): 5 ng/L (ref <18)
Troponin I (High Sensitivity): 8 ng/L (ref <18)

## 2020-04-22 LAB — LIPASE, BLOOD: Lipase: 48 U/L (ref 11–51)

## 2020-04-22 MED ORDER — LIDOCAINE VISCOUS HCL 2 % MT SOLN: 15.0000 mL | Freq: Once | OROMUCOSAL | Status: DC

## 2020-04-22 MED ORDER — FAMOTIDINE 20 MG PO TABS
20.0000 mg | ORAL_TABLET | Freq: Once | ORAL | Status: AC
  Administered 2020-04-22: 20 mg via ORAL
  Filled 2020-04-22: qty 1

## 2020-04-22 MED ORDER — ALUM & MAG HYDROXIDE-SIMETH 200-200-20 MG/5ML PO SUSP: 15.0000 mL | Freq: Once | ORAL | Status: DC

## 2020-04-22 NOTE — Telephone Encounter (Signed)
Pt states is still hurting; hard to describe except it's like continuous heartburn; Tums hasn't helped.  Adv pt to go to hosp - enter thru ED; not sure if the ED will keep her or if they will send her to L&D; allowed one person with her.  Tried to give L&D heads up; wasn't able to stay on hold; sent AMS secure chat giving him heads up.

## 2020-04-22 NOTE — ED Triage Notes (Signed)
Pt to ED sent from OBGYN for upper back pain/shoulder pain, chest tightness similar to heartburn that started at 0600 this morning not relieved by TUMS.  Pt ambulatory to triage, RR even and unlabored, NAD noted.   Discussed pt with Dr Derrill Kay

## 2020-04-22 NOTE — ED Notes (Signed)
Dr Jessup at bedside 

## 2020-04-22 NOTE — ED Provider Notes (Signed)
[facility] Emergency Department Provider Note   ____________________________________________   Event Date/Time   First MD Initiated Contact with Patient 04/22/20 1641     (approximate)  I have reviewed the triage vital signs and the nursing notes.   HISTORY  Chief Complaint Back Pain (/) and Chest Pain    HPI Kelly Burch is a 25 y.o. female, G2P1001 at approximately 34 weeks of pregnancy who presents to the ED complaining of chest and back pain.  Patient reports that ever since she woke up this morning she has been having a burning pain across the center of her chest that has been constant.  It is not exacerbated or alleviated by anything in particular but has started to extend into her upper back.  She has some mild associated shortness of breath, but states that it has not been unusual for her to feel out of breath during this pregnancy.  She denies any fevers or cough, did take Tums earlier today without significant relief.  She has had mild swelling in her legs throughout her pregnancy that is no worse today and she denies any calf pain.  Pregnancy has been uncomplicated thus far.  She discussed her symptoms with her OB/GYN earlier today, who recommended she come to the ED to be seen.        Past Medical History:  Diagnosis Date  . Medical history non-contributory     Patient Active Problem List   Diagnosis Date Noted  . Back pain affecting pregnancy in third trimester 03/08/2020  . Diarrhea during pregnancy   . Supervision of other normal pregnancy, antepartum 10/20/2019  . COVID-19 virus vaccination declined 09/18/2019    Past Surgical History:  Procedure Laterality Date  . EYE SURGERY    . 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 NO PAST SURGERIES    . TONSILLECTOMY      Prior to Admission medications   Medication Sig Start Date End Date Taking? Authorizing Provider  Prenatal Vit-Fe Fumarate-FA (PRENATAL MULTIVITAMIN) TABS tablet Take 1  tablet by mouth at bedtime.    [provider]    Allergies Iodides and Iodine  Family History  Problem Relation Age of Onset  . Asthma Mother   . Hypertension Father   . Cancer Maternal Grandfather   . Diabetes Maternal Grandfather   . Cancer Paternal Grandfather   . Diabetes Son 6       type I  . Alcohol abuse Neg Hx   . Arthritis Neg Hx   . Birth defects Neg Hx   . COPD Neg Hx   . Depression Neg Hx   . Early death Neg Hx   . Hearing loss Neg Hx   . Heart disease Neg Hx   . Hyperlipidemia Neg Hx   . Kidney disease Neg Hx   . Learning disabilities Neg Hx   . Mental illness Neg Hx   . Mental retardation Neg Hx   . Miscarriages / Stillbirths Neg Hx   . Stroke Neg Hx   . Vision loss Neg Hx     Social History Social History   Tobacco Use  . Smoking status: Former Smoker    Packs/day: 0.50    Years: 2.00    Pack years: 1.00    Types: Cigarettes    Quit date: 2019    Years since quitting: 3.2  . Smokeless tobacco: Never Used  Vaping Use  . Vaping Use: Never used  Substance Use Topics  .  Alcohol use: No  . Drug use: No    Review of Systems  Constitutional: No fever/chills Eyes: No visual changes. ENT: No sore throat. Cardiovascular: Positive for chest pain. Respiratory: Denies shortness of breath. Gastrointestinal: No abdominal pain.  No nausea, no vomiting.  No diarrhea.  No constipation. Genitourinary: Negative for dysuria. Musculoskeletal: Positive for for back pain. Skin: Negative for rash. Neurological: Negative for headaches, focal weakness or numbness.  ____________________________________________   PHYSICAL EXAM:  VITAL SIGNS: ED Triage Vitals  Enc Vitals Group     BP 04/22/20 1550 130/77     Pulse Rate 04/22/20 1550 94     Resp 04/22/20 1550 18     Temp 04/22/20 1550 98.7 F (37.1 C)     Temp Source 04/22/20 1550 Oral     SpO2 04/22/20 1550 100 %     Weight 04/22/20 1551 156 lb (70.8 kg)     Height 04/22/20 1551 5\' 3"  (1.6  m)     Head Circumference --      Peak Flow --      Pain Score 04/22/20 1551 2     Pain Loc --      Pain Edu? --      Excl. in GC? --     Constitutional: Alert and oriented. Eyes: Conjunctivae are normal. Head: Atraumatic. Nose: No congestion/rhinnorhea. Mouth/Throat: Mucous membranes are moist. Neck: Normal ROM Cardiovascular: Normal rate, regular rhythm. Grossly normal heart sounds.  2+ radial pulses bilaterally. Respiratory: Normal respiratory effort.  No retractions. Lungs CTAB.  No chest wall tenderness to palpation. Gastrointestinal: Soft and nontender. No distention. Genitourinary: deferred Musculoskeletal: No lower extremity tenderness nor edema. Neurologic:  Normal speech and language. No gross focal neurologic deficits are appreciated. Skin:  Skin is warm, dry and intact. No rash noted. Psychiatric: Mood and affect are normal. Speech and behavior are normal.  ____________________________________________   LABS (all labs ordered are listed, but only abnormal results are displayed)  Labs Reviewed  BASIC METABOLIC PANEL - Abnormal; Notable for the following components:      Result Value   CO2 20 (*)    All other components within normal limits  CBC - Abnormal; Notable for the following components:   WBC 12.8 (*)    RBC 3.61 (*)    Hemoglobin 11.3 (*)    HCT 32.8 (*)    All other components within normal limits  HEPATIC FUNCTION PANEL - Abnormal; Notable for the following components:   Total Protein 6.2 (*)    Albumin 3.1 (*)    All other components within normal limits  LIPASE, BLOOD  TROPONIN I (HIGH SENSITIVITY)  TROPONIN I (HIGH SENSITIVITY)   ____________________________________________  EKG  ED ECG REPORT I, 04/24/20, the attending physician, personally viewed and interpreted this ECG.   Date: 04/22/2020  EKG Time: 15:53  Rate: 99  Rhythm: normal sinus rhythm  Axis: Normal  Intervals:none  ST&T Change: None   PROCEDURES  Procedure(s)  performed (including Critical Care):  Procedures   ____________________________________________   INITIAL IMPRESSION / ASSESSMENT AND PLAN / ED COURSE       25 year old female, G2 P1-0-0-1 at approximately 34 weeks of pregnancy, presents to the ED complaining of burning pain across her entire chest starting this morning and starting to spread into her back.  Pain is not reproducible with palpation of either her chest or back.  EKG shows no evidence of arrhythmia or ischemia and initial troponin is negative, low suspicion for ACS given she  is young and healthy.  Remainder of labs are unremarkable, chest x-ray is pending at this time.  I have low suspicion for PE given she is not tachycardic and has normal O2 sats with no tachypnea.  Symptoms sound more consistent with gastritis and GERD, we will also check LFTs to ensure no elevation but she has no right upper quadrant tenderness.  We will treat symptomatically with Pepcid and reassess.  Patient reports feeling better following dose of Pepcid, LFTs and lipase within normal limits along with repeat troponin.  Chest x-ray reviewed by me and shows no infiltrate, edema, or effusion.  Given reassuring work-up, patient is appropriate for discharge home with OB/GYN follow-up.  She was counseled to return to the ED for new worsening symptoms, patient agrees with plan.      ____________________________________________   FINAL CLINICAL IMPRESSION(S) / ED DIAGNOSES  Final diagnoses:  Atypical chest pain     ED Discharge Orders    None       Note:  This document was prepared using Dragon voice recognition software and may include unintentional dictation errors.   Chesley Noon, MD 04/22/20 1919

## 2020-04-28 ENCOUNTER — Ambulatory Visit (INDEPENDENT_AMBULATORY_CARE_PROVIDER_SITE_OTHER): Payer: Medicaid Other | Admitting: Obstetrics and Gynecology

## 2020-04-28 ENCOUNTER — Encounter: Payer: Self-pay | Admitting: Obstetrics and Gynecology

## 2020-04-28 ENCOUNTER — Ambulatory Visit (INDEPENDENT_AMBULATORY_CARE_PROVIDER_SITE_OTHER): Payer: Medicaid Other

## 2020-04-28 ENCOUNTER — Other Ambulatory Visit: Payer: Self-pay

## 2020-04-28 VITALS — BP 118/74 | Wt 161.0 lb

## 2020-04-28 DIAGNOSIS — Z3480 Encounter for supervision of other normal pregnancy, unspecified trimester: Secondary | ICD-10-CM

## 2020-04-28 DIAGNOSIS — Z3483 Encounter for supervision of other normal pregnancy, third trimester: Secondary | ICD-10-CM

## 2020-04-28 NOTE — Progress Notes (Signed)
Routine Prenatal Care Visit  Subjective  Kelly Burch is a 25 y.o. G2P1001 at [redacted]w[redacted]d being seen today for ongoing prenatal care.  She is currently monitored for the following issues for this low-risk pregnancy and has Supervision of other normal pregnancy, antepartum; COVID-19 virus vaccination declined; Back pain affecting pregnancy in third trimester; and Diarrhea during pregnancy on their problem list.  ----------------------------------------------------------------------------------- Patient reports no complaints.  She would like to cancel her elective cesarean and tubal ligation Contractions: Not present. Vag. Bleeding: None.  Movement: Present. Denies leaking of fluid.  ----------------------------------------------------------------------------------- The following portions of the patient's history were reviewed and updated as appropriate: allergies, current medications, past family history, past medical history, past social history, past surgical history and problem list. Problem list updated.   Objective  Blood pressure 118/74, weight 161 lb (73 kg), last menstrual period 08/24/2019. Pregravid weight 122 lb (55.3 kg) Total Weight Gain 39 lb (17.7 kg) Urinalysis:      Fetal Status: Fetal Heart Rate (bpm): 125 Fundal Height: 36 cm Movement: Present  Presentation: Vertex  General:  Alert, oriented and cooperative. Patient is in no acute distress.  Skin: Skin is warm and dry. No rash noted.   Cardiovascular: Normal heart rate noted  Respiratory: Normal respiratory effort, no problems with respiration noted  Abdomen: Soft, gravid, appropriate for gestational age. Pain/Pressure: Absent     Pelvic:  Cervical exam deferred        Extremities: Normal range of motion.  Edema: None  Mental Status: Normal mood and affect. Normal behavior. Normal judgment and thought content.     Assessment   25 y.o. G2P1001 at [redacted]w[redacted]d by  05/29/2020, by Ultrasound presenting for routine prenatal  visit  Plan   THIRD Problems (from 10/20/19 to present)    Problem Noted Resolved   Back pain affecting pregnancy in third trimester 03/08/2020 by Tresea Mall, CNM No   Supervision of other normal pregnancy, antepartum 10/20/2019 by Mirna Mires, CNM No   Overview Addendum 03/30/2020  8:39 AM by Natale Milch, MD      Nursing Staff Provider  Office Location  [facility] Dating   LMP c/w 9w [place]  Language  English Anatomy US   complete, normal  Flu Vaccine   declined Genetic Screen  NIPS:   Negx3, xy  TDaP vaccine   03/16/20 Hgb A1C or  GTT Early : n/a Third trimester : 98  Rhogam   n/a   LAB RESULTS   Feeding Plan  breast Blood Type A/Positive/-- (10/25 0847)   Contraception  POP Antibody Negative (10/25 0847)  Circumcision  Rubella 15.30 (10/25 0847)  Pediatrician   RPR Non Reactive (02/22 0933)   Support Person  husband HBsAg Negative (10/25 0847)   Prenatal Classes  declined HIV Non Reactive (02/22 0933)    Varicella  immune  BTL Consent  desires Consents 03/30/2020 GBS  (For PCN allergy, check sensitivities)        VBAC Consent  n/a Pap  10/20/19 - NILM    Hgb Electro      CF      SMA               Previous Version      Normal growth and AFI today.  Scheduled induction for the patient on 05/26/2020. Will cancel cesarean section  Gestational age appropriate obstetric precautions including but not limited to vaginal bleeding, contractions, leaking of fluid and fetal movement were reviewed in detail with the patient.  Return in about 1 week (around 05/05/2020) for ROB in person.  Natale Milch MD [facility], [facility] 04/28/2020, 9:31 AM

## 2020-04-28 NOTE — Progress Notes (Signed)
Growth scan today. No vb. No lof. 

## 2020-04-28 NOTE — Patient Instructions (Addendum)
Covid Testing at 05/25/2019 between 8-10:30 am, Drive up testing in front of the [employer]. Follow signs for Pre-admission testing. Please wear a mask.  Induction 05/27/2019 at 8 AM . Enter through the [facility] for an 8 AM induction. Please enter through the ER for a midnight or 5 AM induction.  Please eat a meal prior to your arrival.    Labor Induction Labor induction is when steps are taken to cause a pregnant woman to begin the labor process. Most women go into labor on their own between 37 weeks and 42 weeks of pregnancy. When this does not happen, or when there is a medical need for labor to begin, steps may be taken to induce, or bring on, labor. Labor induction causes a pregnant woman's uterus to contract. It also causes the cervix to soften (ripen), open (dilate), and thin out. Usually, labor is not induced before 39 weeks of pregnancy unless there is a medical reason to do so. When is labor induction considered? Labor induction may be right for you if:  Your pregnancy lasts longer than 41 to 42 weeks.  Your placenta is separating from your uterus (placental abruption).  You have a rupture of membranes and your labor does not begin.  You have health problems, like diabetes or high blood pressure (preeclampsia) during your pregnancy.  Your baby has stopped growing or does not have enough amniotic fluid. Before labor induction begins, your health care provider will consider the following factors:  Your medical condition and the baby's condition.  How many weeks you have been pregnant.  How mature the baby's lungs are.  The condition of your cervix.  The position of the baby.  The size of your birth canal. Tell a health care provider about:  Any allergies you have.  All medicines you are taking, including vitamins, herbs, eye drops, creams, and over-the-counter medicines.  Any problems you or your family members have had with anesthetic medicines.  Any  surgeries you have had.  Any blood disorders you have.  Any medical conditions you have. What are the risks? Generally, this is a safe procedure. However, problems may occur, including:  Failed induction.  Changes in fetal heart rate, such as being too high, too low, or irregular (erratic).  Infection in the mother or the baby.  Increased risk of having a cesarean delivery.  Breaking off (abruption) of the placenta from the uterus. This is rare.  Rupture of the uterus. This is very rare.  Your baby could fail to get enough blood flow or oxygen. This can be life-threatening. When induction is needed for medical reasons, the benefits generally outweigh the risks. What happens during the procedure? During the procedure, your health care provider will use one of these methods to induce labor:  Stripping the membranes. In this method, the amniotic sac tissue is gently separated from the cervix. This causes the following to happen: ? Your cervix stretches, which in turn causes the release of prostaglandins. ? Prostaglandins induce labor and cause the uterus to contract. ? This procedure is often done in an office visit. You will be sent home to wait for contractions to begin.  Prostaglandin medicine. This medicine starts contractions and causes the cervix to dilate and ripen. This can be taken by mouth (orally) or by being inserted into the vagina (suppository).  Inserting a small, thin tube (catheter) with a balloon into the vagina and then expanding the balloon with water to dilate the cervix.  Breaking  the water. In this method, a small instrument is used to make a small hole in the amniotic sac. This eventually causes the amniotic sac to break. Contractions should begin within a few hours.  Medicine to trigger or strengthen contractions. This medicine is given through an IV that is inserted into a vein in your arm. This procedure may vary among health care providers and hospitals.    Where to find more information  March of [employer]: www.marchofdimes.org  The [employer] of [employer]stetricians and Gynecologists: www.acog.org Summary  Labor induction causes a pregnant woman's uterus to contract. It also causes the cervix to soften (ripen), open (dilate), and thin out.  Labor is usually not induced before 39 weeks of pregnancy unless there is a medical reason to do so.  When induction is needed for medical reasons, the benefits generally outweigh the risks.  Talk with your health care provider about which methods of labor induction are right for you. This information is not intended to replace advice given to you by your health care provider. Make sure you discuss any questions you have with your health care provider. Document Revised: 10/09/2019 Document Reviewed: 10/09/2019 Elsevier Patient Education  2021 [employer].     Third Trimester of Pregnancy  The third trimester of pregnancy is from week 28 through week 40. This is months 7 through 9. The third trimester is a time when the unborn baby (fetus) is growing rapidly. At the end of the ninth month, the fetus is about 20 inches long and weighs 6-10 pounds. Body changes during your third trimester During the third trimester, your body will continue to go through many changes. The changes vary and generally return to normal after your baby is born. Physical changes  Your weight will continue to increase. You can expect to gain 25-35 pounds (11-16 kg) by the end of the pregnancy if you begin pregnancy at a normal weight. If you are underweight, you can expect to gain 28-40 lb (about 13-18 kg), and if you are overweight, you can expect to gain 15-25 lb (about 7-11 kg).  You may begin to get stretch marks on your hips, abdomen, and breasts.  Your breasts will continue to grow and may hurt. A yellow fluid (colostrum) may leak from your breasts. This is the first milk you are producing for your baby.  You may have  changes in your hair. These can include thickening of your hair, rapid growth, and changes in texture. Some people also have hair loss during or after pregnancy, or hair that feels dry or thin.  Your belly button may stick out.  You may notice more swelling in your hands, face, or ankles. Health changes  You may have heartburn.  You may have constipation.  You may develop hemorrhoids.  You may develop swollen, bulging veins (varicose veins) in your legs.  You may have increased body aches in the pelvis, back, or thighs. This is due to weight gain and increased hormones that are relaxing your joints.  You may have increased tingling or numbness in your hands, arms, and legs. The skin on your abdomen may also feel numb.  You may feel short of breath because of your expanding uterus. Other changes  You may urinate more often because the fetus is moving lower into your pelvis and pressing on your bladder.  You may have more problems sleeping. This may be caused by the size of your abdomen, an increased need to urinate, and an increase in your  body's metabolism.  You may notice the fetus "dropping," or moving lower in your abdomen (lightening).  You may have increased vaginal discharge.  You may notice that you have pain around your pelvic bone as your uterus distends. Follow these instructions at home: Medicines  Follow your health care provider's instructions regarding medicine use. Specific medicines may be either safe or unsafe to take during pregnancy. Do not take any medicines unless approved by your health care provider.  Take a prenatal vitamin that contains at least 600 micrograms (mcg) of folic acid. Eating and drinking  Eat a healthy diet that includes fresh fruits and vegetables, whole grains, good sources of protein such as meat, eggs, or tofu, and low-fat dairy products.  Avoid raw meat and unpasteurized juice, milk, and cheese. These carry germs that can harm you  and your baby.  Eat 4 or 5 small meals rather than 3 large meals a day.  You may need to take these actions to prevent or treat constipation: ? Drink enough fluid to keep your urine pale yellow. ? Eat foods that are high in fiber, such as beans, whole grains, and fresh fruits and vegetables. ? Limit foods that are high in fat and processed sugars, such as fried or sweet foods. Activity  Exercise only as directed by your health care provider. Most people can continue their usual exercise routine during pregnancy. Try to exercise for 30 minutes at least 5 days a week. Stop exercising if you experience contractions in the uterus.  Stop exercising if you develop pain or cramping in the lower abdomen or lower back.  Avoid heavy lifting.  Do not exercise if it is very hot or humid or if you are at a high altitude.  If you choose to, you may continue to have sex unless your health care provider tells you not to. Relieving pain and discomfort  Take frequent breaks and rest with your legs raised (elevated) if you have leg cramps or low back pain.  Take warm sitz baths to soothe any pain or discomfort caused by hemorrhoids. Use hemorrhoid cream if your health care provider approves.  Wear a supportive bra to prevent discomfort from breast tenderness.  If you develop varicose veins: ? Wear support hose as told by your health care provider. ? Elevate your feet for 15 minutes, 3-4 times a day. ? Limit salt in your diet. Safety  Talk to your health care provider before traveling far distances.  Do not use hot tubs, steam rooms, or saunas.  Wear your seat belt at all times when driving or riding in a car.  Talk with your health care provider if someone is verbally or physically abusive to you. Preparing for birth To prepare for the arrival of your baby:  Take prenatal classes to understand, practice, and ask questions about labor and delivery.  Visit the hospital and tour the maternity  area.  Purchase a rear-facing car seat and make sure you know how to install it in your car.  Prepare the baby's room or sleeping area. Make sure to remove all pillows and stuffed animals from the baby's crib to prevent suffocation. General instructions  Avoid cat litter boxes and soil used by cats. These carry germs that can cause birth defects in the baby. If you have a cat, ask someone to clean the litter box for you.  Do not douche or use tampons. Do not use scented sanitary pads.  Do not use any products that contain nicotine or  tobacco, such as cigarettes, e-cigarettes, and chewing tobacco. If you need help quitting, ask your health care provider.  Do not use any herbal remedies, illegal drugs, or medicines that were not prescribed to you. Chemicals in these products can harm your baby.  Do not drink alcohol.  You will have more frequent prenatal exams during the third trimester. During a routine prenatal visit, your health care provider will do a physical exam, perform tests, and discuss your overall health. Keep all follow-up visits. This is important. Where to find more information  American Pregnancy Association: americanpregnancy.org  [employer] of Obstetricians and Gynecologists: https://www.todd-brady.net/  Office on [employer] [facility]: MightyReward.co.nz Contact a health care provider if you have:  A fever.  Mild pelvic cramps, pelvic pressure, or nagging pain in your abdominal area or lower back.  Vomiting or diarrhea.  Bad-smelling vaginal discharge or foul-smelling urine.  Pain when you urinate.  A headache that does not go away when you take medicine.  Visual changes or see spots in front of your eyes. Get help right away if:  Your water breaks.  You have regular contractions less than 5 minutes apart.  You have spotting or bleeding from your vagina.  You have severe abdominal pain.  You have difficulty breathing.  You have  chest pain.  You have fainting spells.  You have not felt your baby move for the time period told by your health care provider.  You have new or increased pain, swelling, or redness in an arm or leg. Summary  The third trimester of pregnancy is from week 28 through week 40 (months 7 through 9).  You may have more problems sleeping. This can be caused by the size of your abdomen, an increased need to urinate, and an increase in your body's metabolism.  You will have more frequent prenatal exams during the third trimester. Keep all follow-up visits. This is important. This information is not intended to replace advice given to you by your health care provider. Make sure you discuss any questions you have with your health care provider. Document Revised: 06/04/2019 Document Reviewed: 04/10/2019 Elsevier Patient Education  2021 [employer].

## 2020-05-07 ENCOUNTER — Ambulatory Visit (INDEPENDENT_AMBULATORY_CARE_PROVIDER_SITE_OTHER): Payer: Medicaid Other | Admitting: Obstetrics and Gynecology

## 2020-05-07 ENCOUNTER — Other Ambulatory Visit: Payer: Self-pay

## 2020-05-07 ENCOUNTER — Encounter: Payer: Self-pay | Admitting: Obstetrics and Gynecology

## 2020-05-07 VITALS — BP 110/66 | Wt 164.0 lb

## 2020-05-07 DIAGNOSIS — R875 Abnormal microbiological findings in specimens from female genital organs: Secondary | ICD-10-CM

## 2020-05-07 DIAGNOSIS — M549 Dorsalgia, unspecified: Secondary | ICD-10-CM

## 2020-05-07 DIAGNOSIS — Z3480 Encounter for supervision of other normal pregnancy, unspecified trimester: Secondary | ICD-10-CM

## 2020-05-07 DIAGNOSIS — O99891 Other specified diseases and conditions complicating pregnancy: Secondary | ICD-10-CM

## 2020-05-07 LAB — POCT URINALYSIS DIPSTICK OB
Glucose, UA: NEGATIVE
POC,PROTEIN,UA: NEGATIVE

## 2020-05-07 NOTE — Progress Notes (Signed)
Routine Prenatal Care Visit  Subjective  Kelly Burch is a 25 y.o. G2P1001 at [redacted]w[redacted]d being seen today for ongoing prenatal care.  She is currently monitored for the following issues for this low-risk pregnancy and has Supervision of other normal pregnancy, antepartum; COVID-19 virus vaccination declined; Back pain affecting pregnancy in third trimester; and Diarrhea during pregnancy on their problem list.  ----------------------------------------------------------------------------------- Patient reports no complaints.   Contractions: Irritability. Vag. Bleeding: None.  Movement: Present. Denies leaking of fluid.  ----------------------------------------------------------------------------------- The following portions of the patient's history were reviewed and updated as appropriate: allergies, current medications, past family history, past medical history, past social history, past surgical history and problem list. Problem list updated.   Objective  Blood pressure 110/66, weight 164 lb (74.4 kg), last menstrual period 08/24/2019. Pregravid weight 122 lb (55.3 kg) Total Weight Gain 42 lb (19.1 kg) Urinalysis:      Fetal Status: Fetal Heart Rate (bpm): 129 Fundal Height: 36 cm Movement: Present  Presentation: Vertex  General:  Alert, oriented and cooperative. Patient is in no acute distress.  Skin: Skin is warm and dry. No rash noted.   Cardiovascular: Normal heart rate noted  Respiratory: Normal respiratory effort, no problems with respiration noted  Abdomen: Soft, gravid, appropriate for gestational age. Pain/Pressure: Present     Pelvic:  Cervical exam: Dilation: 1 Effacement (%): 20 Station: -3  Extremities: Normal range of motion.  Edema: None  Mental Status: Normal mood and affect. Normal behavior. Normal judgment and thought content.     Assessment   25 y.o. G2P1001 at [redacted]w[redacted]d by  05/29/2020, by Ultrasound presenting for routine prenatal visit  Plan   THIRD Problems  (from 10/20/19 to present)    Problem Noted Resolved   Back pain affecting pregnancy in third trimester 03/08/2020 by Tresea Mall, CNM No   Supervision of other normal pregnancy, antepartum 10/20/2019 by Mirna Mires, CNM No   Overview Addendum 04/28/2020  9:40 AM by Natale Milch, MD      Nursing Staff Provider  Office Location  [facility] Dating   LMP c/w 9w [place]  Language  English Anatomy US   complete, normal  Flu Vaccine   declined Genetic Screen  NIPS:   Negx3, xy  TDaP vaccine   03/16/20 Hgb A1C or  GTT Early : n/a Third trimester : 98  Rhogam   n/a   LAB RESULTS   Feeding Plan  breast Blood Type A/Positive/-- (10/25 0847)   Contraception  POP Antibody Negative (10/25 0847)  Circumcision  Rubella 15.30 (10/25 0847)  Pediatrician   RPR Non Reactive (02/22 0933)   Support Person  significant other HBsAg Negative (10/25 0847)   Prenatal Classes  declined HIV Non Reactive (02/22 0933)    Varicella  immune  BTL Consent No longer desires Consents 03/30/2020 GBS  (For PCN allergy, check sensitivities)        VBAC Consent  n/a Pap  10/20/19 - NILM    Hgb Electro      CF      SMA               Previous Version      GBS, nuswab today  Gestational age appropriate obstetric precautions including but not limited to vaginal bleeding, contractions, leaking of fluid and fetal movement were reviewed in detail with the patient.    No follow-ups on file.  Natale Milch MD [facility], [facility] 05/07/2020, 12:20 PM

## 2020-05-07 NOTE — Progress Notes (Signed)
GBS/Aptima today.Lots of vaginal pain/pressure.

## 2020-05-11 ENCOUNTER — Other Ambulatory Visit: Payer: Self-pay | Admitting: Obstetrics and Gynecology

## 2020-05-11 DIAGNOSIS — B373 Candidiasis of vulva and vagina: Secondary | ICD-10-CM

## 2020-05-11 DIAGNOSIS — B3731 Acute candidiasis of vulva and vagina: Secondary | ICD-10-CM

## 2020-05-11 LAB — CULTURE, BETA STREP (GROUP B ONLY): Strep Gp B Culture: NEGATIVE

## 2020-05-11 LAB — NUSWAB VAGINITIS PLUS (VG+)
Atopobium vaginae: HIGH Score — AB
Candida albicans, NAA: POSITIVE — AB
Candida glabrata, NAA: NEGATIVE
Chlamydia trachomatis, NAA: NEGATIVE
Neisseria gonorrhoeae, NAA: NEGATIVE
Trich vag by NAA: NEGATIVE

## 2020-05-11 MED ORDER — FLUCONAZOLE 150 MG PO TABS: 150.0000 mg | ORAL_TABLET | ORAL | 0 refills | Status: AC

## 2020-05-12 ENCOUNTER — Other Ambulatory Visit: Payer: Self-pay

## 2020-05-12 ENCOUNTER — Encounter: Payer: Self-pay | Admitting: Obstetrics and Gynecology

## 2020-05-12 ENCOUNTER — Ambulatory Visit (INDEPENDENT_AMBULATORY_CARE_PROVIDER_SITE_OTHER): Payer: Medicaid Other | Admitting: Obstetrics and Gynecology

## 2020-05-12 VITALS — BP 108/70 | Ht 63.0 in | Wt 163.4 lb

## 2020-05-12 DIAGNOSIS — G43909 Migraine, unspecified, not intractable, without status migrainosus: Secondary | ICD-10-CM

## 2020-05-12 DIAGNOSIS — Z3A37 37 weeks gestation of pregnancy: Secondary | ICD-10-CM

## 2020-05-12 DIAGNOSIS — Z3480 Encounter for supervision of other normal pregnancy, unspecified trimester: Secondary | ICD-10-CM

## 2020-05-12 LAB — POCT URINALYSIS DIPSTICK OB
Glucose, UA: NEGATIVE
POC,PROTEIN,UA: NEGATIVE

## 2020-05-12 MED ORDER — BUTALBITAL-APAP-CAFFEINE 50-325-40 MG PO TABS
1.0000 | ORAL_TABLET | Freq: Four times a day (QID) | ORAL | 0 refills | Status: AC | PRN
Start: 2020-05-12 — End: 2021-05-12

## 2020-05-12 NOTE — Progress Notes (Signed)
Vision changes and headaches x 2 days

## 2020-05-12 NOTE — Progress Notes (Signed)
Routine Prenatal Care Visit  Subjective  Kelly Burch is a 25 y.o. G2P1001 at [redacted]w[redacted]d being seen today for ongoing prenatal care.  She is currently monitored for the following issues for this low-risk pregnancy and has Supervision of other normal pregnancy, antepartum; COVID-19 virus vaccination declined; Back pain affecting pregnancy in third trimester; and Diarrhea during pregnancy on their problem list.  ----------------------------------------------------------------------------------- Patient reports headache for the last 2 days. She is seeing spots especially while looking at the computer screen for work. Some improvement with tylenol, but not a total resolution   Contractions: Not present. Vag. Bleeding: None.  Movement: Present. Denies leaking of fluid.  ----------------------------------------------------------------------------------- The following portions of the patient's history were reviewed and updated as appropriate: allergies, current medications, past family history, past medical history, past social history, past surgical history and problem list. Problem list updated.   Objective  Blood pressure 108/70, height 5\' 3"  (1.6 m), weight 163 lb 6.4 oz (74.1 kg), last menstrual period 08/24/2019. Pregravid weight 122 lb (55.3 kg) Total Weight Gain 41 lb 6.4 oz (18.8 kg) Urinalysis:      Fetal Status: Fetal Heart Rate (bpm): 155 Fundal Height: 37 cm Movement: Present     General:  Alert, oriented and cooperative. Patient is in no acute distress.  Skin: Skin is warm and dry. No rash noted.   Cardiovascular: Normal heart rate noted  Respiratory: Normal respiratory effort, no problems with respiration noted  Abdomen: Soft, gravid, appropriate for gestational age. Pain/Pressure: Absent     Pelvic:  Cervical exam performed        Extremities: Normal range of motion.  Edema: None  Mental Status: Normal mood and affect. Normal behavior. Normal judgment and thought content.      Assessment   25 y.o. G2P1001 at [redacted]w[redacted]d by  05/29/2020, by Ultrasound presenting for routine prenatal visit  Plan   THIRD Problems (from 10/20/19 to present)    Problem Noted Resolved   Back pain affecting pregnancy in third trimester 03/08/2020 by 03/10/2020, CNM No   Supervision of other normal pregnancy, antepartum 10/20/2019 by 12/20/2019, CNM No   Overview Addendum 05/12/2020  9:31 AM by 07/12/2020, MD      Nursing Staff Provider  Office Location  [facility] Dating   LMP c/w 9w Natale Milch  Language  English Anatomy US   complete, normal  Flu Vaccine   declined Genetic Screen  NIPS:   Negx3, xy  TDaP vaccine   03/16/20 Hgb A1C or  GTT Early : n/a Third trimester : 98  Rhogam   n/a   LAB RESULTS   Feeding Plan  breast Blood Type A/Positive/-- (10/25 0847)   Contraception  POP Antibody Negative (10/25 0847)  Circumcision  Rubella 15.30 (10/25 0847)  Pediatrician   RPR Non Reactive (02/22 0933)   Support Person  significant other HBsAg Negative (10/25 0847)   Prenatal Classes  declined HIV Non Reactive (02/22 0933)    Varicella  immune  BTL Consent No longer desires Consents 03/30/2020 GBS negative       VBAC Consent  n/a Pap  10/20/19 - NILM    Hgb Electro      CF      SMA               Previous Version      Discussed yeast infection, patient has rx for oral medication sent last night.  Rx for Fioricet for headache, if headache does not improve  with medication enouraged her to come to the hospital for further evaluation.   Gestational age appropriate obstetric precautions including but not limited to vaginal bleeding, contractions, leaking of fluid and fetal movement were reviewed in detail with the patient.    Return in about 1 week (around 05/19/2020) for ROB in person.  Natale Milch MD [facility], [facility] 05/12/2020, 9:33 AM

## 2020-05-18 ENCOUNTER — Telehealth: Payer: Self-pay

## 2020-05-18 NOTE — Telephone Encounter (Signed)
Vonna Kotyk from [facility] calling; is faxing an addending physician statement to [place]; is questions [phone] 8am-8pm EST; claim [phone]

## 2020-05-18 NOTE — Telephone Encounter (Signed)
FYI regarding the patient's fmla forms

## 2020-05-20 ENCOUNTER — Other Ambulatory Visit: Payer: Self-pay

## 2020-05-20 ENCOUNTER — Encounter: Payer: Self-pay | Admitting: Obstetrics and Gynecology

## 2020-05-20 ENCOUNTER — Other Ambulatory Visit
Admission: RE | Admit: 2020-05-20 | Discharge: 2020-05-20 | Disposition: A | Discharge status: Home / Self Care | Payer: Medicaid Other | Source: Ambulatory Visit | Attending: Obstetrics and Gynecology | Admitting: Obstetrics and Gynecology

## 2020-05-20 ENCOUNTER — Encounter: Payer: Medicaid Other | Admitting: Obstetrics and Gynecology

## 2020-05-20 ENCOUNTER — Ambulatory Visit (INDEPENDENT_AMBULATORY_CARE_PROVIDER_SITE_OTHER): Payer: Medicaid Other | Admitting: Obstetrics and Gynecology

## 2020-05-20 VITALS — BP 110/70 | Wt 164.6 lb

## 2020-05-20 DIAGNOSIS — B3731 Acute candidiasis of vulva and vagina: Secondary | ICD-10-CM

## 2020-05-20 DIAGNOSIS — B373 Candidiasis of vulva and vagina: Secondary | ICD-10-CM

## 2020-05-20 DIAGNOSIS — Z20822 Contact with and (suspected) exposure to covid-19: Secondary | ICD-10-CM | POA: Diagnosis not present

## 2020-05-20 DIAGNOSIS — Z01812 Encounter for preprocedural laboratory examination: Secondary | ICD-10-CM | POA: Insufficient documentation

## 2020-05-20 DIAGNOSIS — Z3A38 38 weeks gestation of pregnancy: Secondary | ICD-10-CM

## 2020-05-20 DIAGNOSIS — O368190 Decreased fetal movements, unspecified trimester, not applicable or unspecified: Secondary | ICD-10-CM

## 2020-05-20 DIAGNOSIS — Z3480 Encounter for supervision of other normal pregnancy, unspecified trimester: Secondary | ICD-10-CM

## 2020-05-20 LAB — FETAL NONSTRESS TEST

## 2020-05-20 LAB — SARS CORONAVIRUS 2 (TAT 6-24 HRS): SARS Coronavirus 2: NEGATIVE

## 2020-05-20 MED ORDER — FLUCONAZOLE 150 MG PO TABS: 150.0000 mg | ORAL_TABLET | ORAL | 1 refills | Status: AC

## 2020-05-20 NOTE — Patient Instructions (Signed)
Labor Induction Labor induction is when steps are taken to cause a pregnant woman to begin the labor process. Most women go into labor on their own between 37 weeks and 42 weeks of pregnancy. When this does not happen, or when there is a medical need for labor to begin, steps may be taken to induce, or bring on, labor. Labor induction causes a pregnant woman's uterus to contract. It also causes the cervix to soften (ripen), open (dilate), and thin out. Usually, labor is not induced before 39 weeks of pregnancy unless there is a medical reason to do so. When is labor induction considered? Labor induction may be right for you if:  Your pregnancy lasts longer than 41 to 42 weeks.  Your placenta is separating from your uterus (placental abruption).  You have a rupture of membranes and your labor does not begin.  You have health problems, like diabetes or high blood pressure (preeclampsia) during your pregnancy.  Your baby has stopped growing or does not have enough amniotic fluid. Before labor induction begins, your health care provider will consider the following factors:  Your medical condition and the baby's condition.  How many weeks you have been pregnant.  How mature the baby's lungs are.  The condition of your cervix.  The position of the baby.  The size of your birth canal. Tell a health care provider about:  Any allergies you have.  All medicines you are taking, including vitamins, herbs, eye drops, creams, and over-the-counter medicines.  Any problems you or your family members have had with anesthetic medicines.  Any surgeries you have had.  Any blood disorders you have.  Any medical conditions you have. What are the risks? Generally, this is a safe procedure. However, problems may occur, including:  Failed induction.  Changes in fetal heart rate, such as being too high, too low, or irregular (erratic).  Infection in the mother or the baby.  Increased risk of  having a cesarean delivery.  Breaking off (abruption) of the placenta from the uterus. This is rare.  Rupture of the uterus. This is very rare.  Your baby could fail to get enough blood flow or oxygen. This can be life-threatening. When induction is needed for medical reasons, the benefits generally outweigh the risks. What happens during the procedure? During the procedure, your health care provider will use one of these methods to induce labor:  Stripping the membranes. In this method, the amniotic sac tissue is gently separated from the cervix. This causes the following to happen: ? Your cervix stretches, which in turn causes the release of prostaglandins. ? Prostaglandins induce labor and cause the uterus to contract. ? This procedure is often done in an office visit. You will be sent home to wait for contractions to begin.  Prostaglandin medicine. This medicine starts contractions and causes the cervix to dilate and ripen. This can be taken by mouth (orally) or by being inserted into the vagina (suppository).  Inserting a small, thin tube (catheter) with a balloon into the vagina and then expanding the balloon with water to dilate the cervix.  Breaking the water. In this method, a small instrument is used to make a small hole in the amniotic sac. This eventually causes the amniotic sac to break. Contractions should begin within a few hours.  Medicine to trigger or strengthen contractions. This medicine is given through an IV that is inserted into a vein in your arm. This procedure may vary among health care providers and hospitals.     Where to find more information  March of Dimes: www.marchofdimes.org  The American College of Obstetricians and Gynecologists: www.acog.org Summary  Labor induction causes a pregnant woman's uterus to contract. It also causes the cervix to soften (ripen), open (dilate), and thin out.  Labor is usually not induced before 39 weeks of pregnancy unless  there is a medical reason to do so.  When induction is needed for medical reasons, the benefits generally outweigh the risks.  Talk with your health care provider about which methods of labor induction are right for you. This information is not intended to replace advice given to you by your health care provider. Make sure you discuss any questions you have with your health care provider. Document Revised: 10/09/2019 Document Reviewed: 10/09/2019 Elsevier Patient Education  2021 Elsevier Inc.  

## 2020-05-20 NOTE — Progress Notes (Signed)
Routine Prenatal Care Visit  Subjective  Kelly Burch is a 25 y.o. G2P1001 at [redacted]w[redacted]d being seen today for ongoing prenatal care.  She is currently monitored for the following issues for this low-risk pregnancy and has Supervision of other normal pregnancy, antepartum; COVID-19 virus vaccination declined; Back pain affecting pregnancy in third trimester; and Diarrhea during pregnancy on their problem list.  ----------------------------------------------------------------------------------- Patient reports decrease fetal movements this week and continued issues with yeast vaginitis.   Contractions: Irregular. Vag. Bleeding: None.  Movement: Present. Denies leaking of fluid.  ----------------------------------------------------------------------------------- The following portions of the patient's history were reviewed and updated as appropriate: allergies, current medications, past family history, past medical history, past social history, past surgical history and problem list. Problem list updated.   Objective  Blood pressure 110/70, weight 164 lb 9.6 oz (74.7 kg), last menstrual period 08/24/2019. Pregravid weight 122 lb (55.3 kg) Total Weight Gain 42 lb 9.6 oz (19.3 kg) Urinalysis:      Fetal Status: Fetal Heart Rate (bpm): 130 Fundal Height: 38 cm Movement: Present  Presentation: Vertex  General:  Alert, oriented and cooperative. Patient is in no acute distress.  Skin: Skin is warm and dry. No rash noted.   Cardiovascular: Normal heart rate noted  Respiratory: Normal respiratory effort, no problems with respiration noted  Abdomen: Soft, gravid, appropriate for gestational age. Pain/Pressure: Absent     Pelvic:  Cervical exam performed Dilation: 3 Effacement (%): 70 Station: -3  Extremities: Normal range of motion.  Edema: None  Mental Status: Normal mood and affect. Normal behavior. Normal judgment and thought content.     Assessment   25 y.o. G2P1001 at [redacted]w[redacted]d by   05/29/2020, by Ultrasound presenting for routine prenatal visit  Plan   THIRD Problems (from 10/20/19 to present)    Problem Noted Resolved   Back pain affecting pregnancy in third trimester 03/08/2020 by Tresea Mall, CNM No   Supervision of other normal pregnancy, antepartum 10/20/2019 by Mirna Mires, CNM No   Overview Addendum 05/12/2020  9:31 AM by Natale Milch, MD      Nursing Staff Provider  Office Location  [facility] Dating   LMP c/w 9w [place]  Language  English Anatomy US   complete, normal  Flu Vaccine   declined Genetic Screen  NIPS:   Negx3, xy  TDaP vaccine   03/16/20 Hgb A1C or  GTT Early : n/a Third trimester : 98  Rhogam   n/a   LAB RESULTS   Feeding Plan  breast Blood Type A/Positive/-- (10/25 0847)   Contraception  POP Antibody Negative (10/25 0847)  Circumcision  Rubella 15.30 (10/25 0847)  Pediatrician   RPR Non Reactive (02/22 0933)   Support Person  significant other HBsAg Negative (10/25 0847)   Prenatal Classes  declined HIV Non Reactive (02/22 0933)    Varicella  immune  BTL Consent No longer desires Consents 03/30/2020 GBS negative       VBAC Consent  n/a Pap  10/20/19 - NILM    Hgb Electro      CF      SMA               Previous Version        NST: 130 bpm baseline, moderate variability, 15x15 accelerations, no decelerations. Diflucan every 3 days until resolution of vaginitis. Rx sent.   Gestational age appropriate obstetric precautions including but not limited to vaginal bleeding, contractions, leaking of fluid and fetal movement were reviewed  in detail with the patient.    Return if symptoms worsen or fail to improve.  Natale Milch MD [facility], [facility] 05/20/2020, 10:51 AM

## 2020-05-21 ENCOUNTER — Other Ambulatory Visit: Payer: BC Managed Care – PPO

## 2020-05-22 ENCOUNTER — Encounter: Payer: Self-pay | Admitting: Obstetrics and Gynecology

## 2020-05-22 ENCOUNTER — Inpatient Hospital Stay
Admission: EM | Admit: 2020-05-22 | Discharge: 2020-05-23 | DRG: 806 | Disposition: A | Discharge status: Home / Self Care | Payer: Medicaid Other | Attending: Obstetrics and Gynecology | Admitting: Obstetrics and Gynecology

## 2020-05-22 ENCOUNTER — Other Ambulatory Visit: Payer: Self-pay

## 2020-05-22 ENCOUNTER — Inpatient Hospital Stay: Payer: Medicaid Other | Admitting: Anesthesiology

## 2020-05-22 DIAGNOSIS — D62 Acute posthemorrhagic anemia: Secondary | ICD-10-CM | POA: Diagnosis not present

## 2020-05-22 DIAGNOSIS — Z87891 Personal history of nicotine dependence: Secondary | ICD-10-CM | POA: Diagnosis present

## 2020-05-22 DIAGNOSIS — O9081 Anemia of the puerperium: Secondary | ICD-10-CM | POA: Diagnosis not present

## 2020-05-22 DIAGNOSIS — Z3A39 39 weeks gestation of pregnancy: Secondary | ICD-10-CM | POA: Diagnosis not present

## 2020-05-22 DIAGNOSIS — M549 Dorsalgia, unspecified: Secondary | ICD-10-CM

## 2020-05-22 DIAGNOSIS — Z3480 Encounter for supervision of other normal pregnancy, unspecified trimester: Secondary | ICD-10-CM

## 2020-05-22 LAB — CBC
HCT: 35 % — ABNORMAL LOW (ref 36.0–46.0)
Hemoglobin: 11.7 g/dL — ABNORMAL LOW (ref 12.0–15.0)
MCH: 30.4 pg (ref 26.0–34.0)
MCHC: 33.4 g/dL (ref 30.0–36.0)
MCV: 90.9 fL (ref 80.0–100.0)
Platelets: 196 10*3/uL (ref 150–400)
RBC: 3.85 MIL/uL — ABNORMAL LOW (ref 3.87–5.11)
RDW: 13 % (ref 11.5–15.5)
WBC: 13.4 10*3/uL — ABNORMAL HIGH (ref 4.0–10.5)
nRBC: 0 % (ref 0.0–0.2)

## 2020-05-22 LAB — TYPE AND SCREEN
ABO/RH(D): A POS
Antibody Screen: NEGATIVE

## 2020-05-22 MED ORDER — LIDOCAINE HCL (PF) 1 % IJ SOLN: 30.0000 mL | INTRAMUSCULAR | Status: DC | PRN

## 2020-05-22 MED ORDER — SOD CITRATE-CITRIC ACID 500-334 MG/5ML PO SOLN: 30.0000 mL | ORAL | Status: DC | PRN

## 2020-05-22 MED ORDER — METHYLERGONOVINE MALEATE 0.2 MG/ML IJ SOLN
0.2000 mg | Freq: Once | INTRAMUSCULAR | Status: AC
  Administered 2020-05-22: 0.2 mg via INTRAMUSCULAR
  Filled 2020-05-22: qty 1

## 2020-05-22 MED ORDER — OXYTOCIN-SODIUM CHLORIDE 30-0.9 UT/500ML-% IV SOLN
INTRAVENOUS | Status: AC
  Administered 2020-05-22: 2 m[IU]/min via INTRAVENOUS
  Filled 2020-05-22: qty 1000

## 2020-05-22 MED ORDER — LACTATED RINGERS IV SOLN
500.0000 mL | Freq: Once | INTRAVENOUS | Status: AC
  Administered 2020-05-22: 500 mL via INTRAVENOUS

## 2020-05-22 MED ORDER — TERBUTALINE SULFATE 1 MG/ML IJ SOLN: 0.2500 mg | Freq: Once | INTRAMUSCULAR | Status: DC | PRN

## 2020-05-22 MED ORDER — IBUPROFEN 600 MG PO TABS
600.0000 mg | ORAL_TABLET | Freq: Four times a day (QID) | ORAL | Status: DC
  Administered 2020-05-22 – 2020-05-23 (×4): 600 mg via ORAL
  Filled 2020-05-22 (×4): qty 1

## 2020-05-22 MED ORDER — EPHEDRINE 5 MG/ML INJ: 10.0000 mg | INTRAVENOUS | Status: DC | PRN

## 2020-05-22 MED ORDER — ONDANSETRON HCL 4 MG/2ML IJ SOLN: 4.0000 mg | Freq: Four times a day (QID) | INTRAMUSCULAR | Status: DC | PRN

## 2020-05-22 MED ORDER — ONDANSETRON HCL 4 MG PO TABS
4.0000 mg | ORAL_TABLET | ORAL | Status: DC | PRN
  Filled 2020-05-22: qty 1

## 2020-05-22 MED ORDER — COCONUT OIL OIL: 1.0000 "application " | TOPICAL_OIL | Status: DC | PRN

## 2020-05-22 MED ORDER — FENTANYL 2.5 MCG/ML W/ROPIVACAINE 0.15% IN NS 100 ML EPIDURAL (ARMC)
EPIDURAL | Status: AC
  Filled 2020-05-22: qty 100

## 2020-05-22 MED ORDER — BUTORPHANOL TARTRATE 1 MG/ML IJ SOLN
2.0000 mg | INTRAMUSCULAR | Status: DC | PRN
  Administered 2020-05-22: 0.5 mg via INTRAVENOUS
  Filled 2020-05-22: qty 2

## 2020-05-22 MED ORDER — WITCH HAZEL-GLYCERIN EX PADS: 1.0000 "application " | MEDICATED_PAD | CUTANEOUS | Status: DC | PRN

## 2020-05-22 MED ORDER — OXYTOCIN-SODIUM CHLORIDE 30-0.9 UT/500ML-% IV SOLN
2.5000 [IU]/h | INTRAVENOUS | Status: DC
  Administered 2020-05-22: 2.5 [IU]/h via INTRAVENOUS

## 2020-05-22 MED ORDER — DIBUCAINE (PERIANAL) 1 % EX OINT: 1.0000 "application " | TOPICAL_OINTMENT | CUTANEOUS | Status: DC | PRN

## 2020-05-22 MED ORDER — LIDOCAINE HCL (PF) 1 % IJ SOLN
INTRAMUSCULAR | Status: DC | PRN
  Administered 2020-05-22: 3 mL via SUBCUTANEOUS

## 2020-05-22 MED ORDER — LIDOCAINE-EPINEPHRINE (PF) 1.5 %-1:200000 IJ SOLN
INTRAMUSCULAR | Status: DC | PRN
  Administered 2020-05-22: 4 mL via EPIDURAL

## 2020-05-22 MED ORDER — FLUCONAZOLE 50 MG PO TABS
150.0000 mg | ORAL_TABLET | Freq: Once | ORAL | Status: AC
  Administered 2020-05-22: 150 mg via ORAL
  Filled 2020-05-22: qty 1

## 2020-05-22 MED ORDER — MISOPROSTOL 200 MCG PO TABS: 800.0000 ug | ORAL_TABLET | Freq: Once | ORAL | Status: DC

## 2020-05-22 MED ORDER — MISOPROSTOL 200 MCG PO TABS
ORAL_TABLET | ORAL | Status: AC
  Filled 2020-05-22: qty 4

## 2020-05-22 MED ORDER — ACETAMINOPHEN 325 MG PO TABS: 650.0000 mg | ORAL_TABLET | ORAL | Status: DC | PRN

## 2020-05-22 MED ORDER — LACTATED RINGERS IV SOLN: INTRAVENOUS | Status: DC

## 2020-05-22 MED ORDER — FENTANYL 2.5 MCG/ML W/ROPIVACAINE 0.15% IN NS 100 ML EPIDURAL (ARMC)
12.0000 mL/h | EPIDURAL | Status: DC
  Administered 2020-05-22: 12 mL/h via EPIDURAL

## 2020-05-22 MED ORDER — DIPHENHYDRAMINE HCL 25 MG PO CAPS: 25.0000 mg | ORAL_CAPSULE | Freq: Four times a day (QID) | ORAL | Status: DC | PRN

## 2020-05-22 MED ORDER — LACTATED RINGERS IV SOLN: 500.0000 mL | INTRAVENOUS | Status: DC | PRN

## 2020-05-22 MED ORDER — LIDOCAINE HCL (PF) 1 % IJ SOLN
INTRAMUSCULAR | Status: AC
  Filled 2020-05-22: qty 30

## 2020-05-22 MED ORDER — TRANEXAMIC ACID-NACL 1000-0.7 MG/100ML-% IV SOLN
1000.0000 mg | Freq: Once | INTRAVENOUS | Status: DC | PRN
  Filled 2020-05-22: qty 100

## 2020-05-22 MED ORDER — SIMETHICONE 80 MG PO CHEW: 80.0000 mg | CHEWABLE_TABLET | ORAL | Status: DC | PRN

## 2020-05-22 MED ORDER — OXYTOCIN BOLUS FROM INFUSION
333.0000 mL | Freq: Once | INTRAVENOUS | Status: AC
  Administered 2020-05-22: 333 mL via INTRAVENOUS

## 2020-05-22 MED ORDER — DOCUSATE SODIUM 100 MG PO CAPS
100.0000 mg | ORAL_CAPSULE | Freq: Two times a day (BID) | ORAL | Status: DC
  Administered 2020-05-22 – 2020-05-23 (×2): 100 mg via ORAL
  Filled 2020-05-22 (×3): qty 1

## 2020-05-22 MED ORDER — AMMONIA AROMATIC IN INHA
RESPIRATORY_TRACT | Status: AC
  Filled 2020-05-22: qty 10

## 2020-05-22 MED ORDER — OXYTOCIN 10 UNIT/ML IJ SOLN
INTRAMUSCULAR | Status: AC
  Filled 2020-05-22: qty 2

## 2020-05-22 MED ORDER — PHENYLEPHRINE 40 MCG/ML (10ML) SYRINGE FOR IV PUSH (FOR BLOOD PRESSURE SUPPORT): 80.0000 ug | PREFILLED_SYRINGE | INTRAVENOUS | Status: DC | PRN

## 2020-05-22 MED ORDER — BENZOCAINE-MENTHOL 20-0.5 % EX AERO
1.0000 "application " | INHALATION_SPRAY | CUTANEOUS | Status: DC | PRN
  Administered 2020-05-22: 1 via TOPICAL
  Filled 2020-05-22 (×2): qty 56

## 2020-05-22 MED ORDER — ZOLPIDEM TARTRATE 5 MG PO TABS: 5.0000 mg | ORAL_TABLET | Freq: Every evening | ORAL | Status: DC | PRN

## 2020-05-22 MED ORDER — PRENATAL MULTIVITAMIN CH
1.0000 | ORAL_TABLET | Freq: Every day | ORAL | Status: DC
  Administered 2020-05-23: 1 via ORAL
  Filled 2020-05-22: qty 1

## 2020-05-22 MED ORDER — ACETAMINOPHEN 500 MG PO TABS
1000.0000 mg | ORAL_TABLET | Freq: Four times a day (QID) | ORAL | Status: DC
  Administered 2020-05-22 – 2020-05-23 (×3): 1000 mg via ORAL
  Filled 2020-05-22 (×3): qty 2

## 2020-05-22 MED ORDER — DIPHENHYDRAMINE HCL 50 MG/ML IJ SOLN: 12.5000 mg | INTRAMUSCULAR | Status: DC | PRN

## 2020-05-22 MED ORDER — ONDANSETRON HCL 4 MG/2ML IJ SOLN: 4.0000 mg | INTRAMUSCULAR | Status: DC | PRN

## 2020-05-22 MED ORDER — BUPIVACAINE HCL (PF) 0.25 % IJ SOLN
INTRAMUSCULAR | Status: DC | PRN
  Administered 2020-05-22: 3 mL via EPIDURAL
  Administered 2020-05-22: 5 mL via EPIDURAL

## 2020-05-22 MED ORDER — OXYTOCIN-SODIUM CHLORIDE 30-0.9 UT/500ML-% IV SOLN
1.0000 m[IU]/min | INTRAVENOUS | Status: DC
  Administered 2020-05-22: 4 m[IU]/min via INTRAVENOUS

## 2020-05-22 NOTE — Discharge Summary (Signed)
Postpartum Discharge Summary  Date of Service updated: 05/23/2020    Patient Name: Kelly Burch DOB: 1995/04/30 MRN: [id number]  Date of admission: 05/22/2020 Delivery date:05/22/2020  Delivering provider: Adrian Prows R  Date of discharge: 05/22/2020  Admitting diagnosis: [redacted] weeks gestation of pregnancy [Z3A.39] Intrauterine pregnancy: [redacted]w[redacted]d    Secondary diagnosis:  Active Problems:   [redacted] weeks gestation of pregnancy  Additional problems: None    Discharge diagnosis: Term Pregnancy Delivered                                              Post partum procedures:none Augmentation: AROM and Pitocin Complications: None  Hospital course: Induction of Labor With Vaginal Delivery   25y.o. yo G3P1011 at 370w0das admitted to the hospital 05/22/2020 for induction of labor.  Indication for induction: Favorable cervix at term.  Patient had an uncomplicated labor course as follows: Membrane Rupture Time/Date: 1:42 PM ,05/22/2020   Delivery Method:Vaginal, Spontaneous  Episiotomy: None  Lacerations:  2nd degree;Perineal  Details of delivery can be found in separate delivery note.  Patient had a routine postpartum course. Patient is discharged home 05/22/20.  Newborn Data: Birth date:05/22/2020  Birth time:1:54 PM  Gender:Female  Living status:Living  Apgars:8 ,9  Weight:   Magnesium Sulfate received: No BMZ received: No Rhophylac:No MMR:No T-DaP:Given prenatally Flu: No Transfusion:No  Physical exam  Vitals:   05/22/20 0930 05/22/20 1113 05/22/20 1200 05/22/20 1432  BP: 122/82 126/76    Pulse: 79 90    Resp:   16 16  Temp:   97.9 F (36.6 C) 97.9 F (36.6 C)  TempSrc:   Oral Oral  Weight:      Height:       General: alert, cooperative and no distress Lochia: appropriate Uterine Fundus: firm Incision: N/A DVT Evaluation: No evidence of DVT seen on physical exam. Labs: Lab Results  Component Value Date   WBC 13.4 (H) 05/22/2020   HGB 11.7 (L) 05/22/2020    HCT 35.0 (L) 05/22/2020   MCV 90.9 05/22/2020   PLT 196 05/22/2020   CMP Latest Ref Rng & Units 04/22/2020  Glucose 70 - 99 mg/dL 94  BUN 6 - 20 mg/dL 7  Creatinine 0.44 - 1.00 mg/dL 0.48  Sodium 135 - 145 mmol/L 135  Potassium 3.5 - 5.1 mmol/L 3.7  Chloride 98 - 111 mmol/L 107  CO2 22 - 32 mmol/L 20(L)  Calcium 8.9 - 10.3 mg/dL 9.1  Total Protein 6.5 - 8.1 g/dL 6.2(L)  Total Bilirubin 0.3 - 1.2 mg/dL 0.6  Alkaline Phos 38 - 126 U/L 119  AST 15 - 41 U/L 21  ALT 0 - 44 U/L 11   Edinburgh Score: No flowsheet data found.    After visit meds:  Allergies as of 05/23/2020      Reactions   Iodides Anxiety, Hives, Itching, Palpitations   After injection of IV contrast for CT, pt began having palpitations, tachycardia, would not speak, after rapid response team arrived rash and hives with itching began.  Transferred to ED.   Iodine Anxiety, Itching, Rash, Palpitations   After injection of IV contrast for CT, pt began having palpitations, tachycardia, would not speak, after rapid response team arrived rash and hives with itching began. Transferred to ED. After injection of IV contrast for CT, pt began having palpitations, tachycardia, would not  speak, after rapid response team arrived rash and hives with itching began. Transferred to ED. After injection of IV contrast for CT, pt began having palpitations, tachycardia, would not speak, after rapid response team arrived rash and hives with itching began. Transferred to ED.      Medication List    TAKE these medications   butalbital-acetaminophen-caffeine 50-325-40 MG tablet Commonly known as: FIORICET Take 1-2 tablets by mouth every 6 (six) hours as needed for headache.   fluconazole 150 MG tablet Commonly known as: DIFLUCAN Take 1 tablet (150 mg total) by mouth every 3 (three) days for 5 doses.   ibuprofen 600 MG tablet Commonly known as: ADVIL Take 1 tablet (600 mg total) by mouth every 6 (six) hours as needed.   prenatal  multivitamin Tabs tablet Take 1 tablet by mouth at bedtime.            Discharge Care Instructions  (From admission, onward)         Start     Ordered   05/23/20 0000  Discharge wound care:       Comments: SHOWER DAILY Wash incision gently with soap and water.  Call office with any drainage, redness, or firmness of the incision.   05/23/20 1101           Discharge home in stable condition Infant Feeding: Bottle Infant Disposition:home with mother Discharge instruction: per After Visit Summary and Postpartum booklet. Activity: Advance as tolerated. Pelvic rest for 6 weeks.  Diet: routine diet Anticipated Birth Control: OCp starting 6 weeks postpartum Postpartum Appointment:2 weeks Additional Postpartum F/U: none Future Appointments:No future appointments. Follow up Visit:  Follow-up Information    Anniece Bleiler R, MD Follow up in 2 week(s).   Specialty: Obstetrics and Gynecology Contact information: [address] [place] [address] [phone]                   05/22/2020 Homero Fellers, MD

## 2020-05-22 NOTE — H&P (Signed)
Kelly Burch is an 25 y.o. female.   Chief Complaint: Induction of labor HPI: Patient is her for induction of labor. She is feeling well. She denies leakage of fluid. She denies vaginal bleeding. She denies contractions. She is feeling normal fetal movement.   THIRD Problems (from 10/20/19 to present)     Problem Noted Resolved   Back pain affecting pregnancy in third trimester 03/08/2020 by Tresea Mall, CNM No   Supervision of other normal pregnancy, antepartum 10/20/2019 by Mirna Mires, CNM No   Overview Addendum 05/12/2020  9:31 AM by Natale Milch, MD      Nursing Staff Provider  Office Location  [facility] Dating   LMP c/w 9w [place]  Language  English Anatomy US   complete, normal  Flu Vaccine   declined Genetic Screen  NIPS:   Negx3, xy  TDaP vaccine   03/16/20 Hgb A1C or  GTT Early : n/a Third trimester : 98  Rhogam   n/a   LAB RESULTS   Feeding Plan  breast Blood Type A/Positive/-- (10/25 0847)   Contraception  POP Antibody Negative (10/25 0847)  Circumcision  Rubella 15.30 (10/25 0847)  Pediatrician   RPR Non Reactive (02/22 0933)   Support Person  significant other HBsAg Negative (10/25 0847)   Prenatal Classes  declined HIV Non Reactive (02/22 0933)    Varicella  immune  BTL Consent No longer desires Consents 03/30/2020 GBS negative       VBAC Consent  n/a Pap  10/20/19 - NILM    Hgb Electro      CF      SMA               Previous Version        Past Medical History:  Diagnosis Date   Medical history non-contributory     Past Surgical History:  Procedure Laterality Date   EYE SURGERY     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   NO PAST SURGERIES     TONSILLECTOMY      Family History  Problem Relation Age of Onset   Asthma Mother    Hypertension Father    Cancer Maternal Grandfather    Diabetes Maternal Grandfather    Cancer Paternal Grandfather    Diabetes Son 6       type I   Alcohol abuse Neg Hx    Arthritis Neg Hx    Birth defects Neg  Hx    COPD Neg Hx    Depression Neg Hx    Early death Neg Hx    Hearing loss Neg Hx    Heart disease Neg Hx    Hyperlipidemia Neg Hx    Kidney disease Neg Hx    Learning disabilities Neg Hx    Mental illness Neg Hx    Mental retardation Neg Hx    Miscarriages / Stillbirths Neg Hx    Stroke Neg Hx    Vision loss Neg Hx    Social History:  reports that she quit smoking about 3 years ago. Her smoking use included cigarettes. She has a 1.00 pack-year smoking history. She has never used smokeless tobacco. She reports that she does not drink alcohol and does not use drugs.  Allergies:  Allergies  Allergen Reactions   Iodides Anxiety, Hives, Itching and Palpitations    After injection of IV contrast for CT, pt began having palpitations, tachycardia, would not speak, after rapid response team arrived rash and hives with itching began.  Transferred to ED.   Iodine Anxiety, Itching, Rash and Palpitations    After injection of IV contrast for CT, pt began having palpitations, tachycardia, would not speak, after rapid response team arrived rash and hives with itching began.  Transferred to ED. After injection of IV contrast for CT, pt began having palpitations, tachycardia, would not speak, after rapid response team arrived rash and hives with itching began.  Transferred to ED. After injection of IV contrast for CT, pt began having palpitations, tachycardia, would not speak, after rapid response team arrived rash and hives with itching began.  Transferred to ED.    Medications Prior to Admission  Medication Sig Dispense Refill   Prenatal Vit-Fe Fumarate-FA (PRENATAL MULTIVITAMIN) TABS tablet Take 1 tablet by mouth at bedtime.     butalbital-acetaminophen-caffeine (FIORICET) 50-325-40 MG tablet Take 1-2 tablets by mouth every 6 (six) hours as needed for headache. (Patient not taking: Reported on 05/22/2020) 10 tablet 0   fluconazole (DIFLUCAN) 150 MG tablet Take 1 tablet (150 mg total) by mouth  every 3 (three) days for 5 doses. (Patient not taking: Reported on 05/22/2020) 5 tablet 1    Results for orders placed or performed during the hospital encounter of 05/22/20 (from the past 48 hour(s))  CBC     Status: Abnormal   Collection Time: 05/22/20  7:46 AM  Result Value Ref Range   WBC 13.4 (H) 4.0 - 10.5 K/uL   RBC 3.85 (L) 3.87 - 5.11 MIL/uL   Hemoglobin 11.7 (L) 12.0 - 15.0 g/dL   HCT 32.2 (L) 02.5 - 42.7 %   MCV 90.9 80.0 - 100.0 fL   MCH 30.4 26.0 - 34.0 pg   MCHC 33.4 30.0 - 36.0 g/dL   RDW 06.2 37.6 - 28.3 %   Platelets 196 150 - 400 K/uL   nRBC 0.0 0.0 - 0.2 %    Comment: Performed at [facility], [address] [place], [place] [address]   No results found.  Review of Systems  Constitutional: Negative for chills and fever.  HENT: Negative for congestion, hearing loss and sinus pain.   Respiratory: Negative for cough, shortness of breath and wheezing.   Cardiovascular: Negative for chest pain, palpitations and leg swelling.  Gastrointestinal: Negative for abdominal pain, constipation, diarrhea, nausea and vomiting.  Genitourinary: Negative for dysuria, flank pain, frequency, hematuria and urgency.  Musculoskeletal: Negative for back pain.  Skin: Negative for rash.  Neurological: Negative for dizziness and headaches.  Psychiatric/Behavioral: Negative for suicidal ideas. The patient is not nervous/anxious.     Height 5\' 3"  (1.6 m), weight 74.4 kg, last menstrual period 08/24/2019. Physical Exam Vitals and nursing note reviewed.  Constitutional:      Appearance: She is well-developed.  HENT:     Head: Normocephalic and atraumatic.  Cardiovascular:     Rate and Rhythm: Normal rate and regular rhythm.  Pulmonary:     Effort: Pulmonary effort is normal.     Breath sounds: Normal breath sounds.  Abdominal:     General: Bowel sounds are normal.     Palpations: Abdomen is soft.  Musculoskeletal:        General: Normal range of motion.  Skin:     General: Skin is warm and dry.  Neurological:     Mental Status: She is alert and oriented to person, place, and time.  Psychiatric:        Behavior: Behavior normal.        Thought Content: Thought content normal.  Judgment: Judgment normal.     NST: 120 bpm baseline, moderate variability, 15x15 accelerations, no decelerations. Tocometer : quiet  Assessment/Plan  24 y.o. [id number] [redacted]w[redacted]d here for scheduled induction of labor. Induction has been planned because of favorable cervix at term.  Will begin induction with pitocine.  Normal fetal monitoring per unit policy Reviewed option for pain management with patient. Patient does desire an epidural.  Normal admission labs GBS: negative  Adelene Idler MD, [facility], [facility] 05/22/2020 8:50 AM     Natale Milch, MD 05/22/2020, 8:40 AM

## 2020-05-22 NOTE — Plan of Care (Signed)
Alert and oriented with pleasant affect. Color good, skin w&d. BBS clear. Assisted up to B.R,,steady gait, and Pt. Voided 600cc. Fundus is firm at U/E, small Lochia Rubra. Denies c/o. Instructed in wearing Bra since she is Formula feeding and Pt v/o.

## 2020-05-22 NOTE — Lactation Note (Signed)
This note was copied from a baby's chart. Lactation Consultation Note  Patient Name: Kelly Burch [id number] Date: 05/22/2020   Age:26 hours  Mom had originally said she only wanted to formula feed.  Mom still reports she only wants to formula feed.  Mom formula fed her first baby as well.  Hand out given on Infant Formula Preparation and reviewed cleaning and sanitizing the workspace, bottles and preparation equipment, mixing with a safe water source, giving pacifiers, protecting from cronobacter, cue based feeding and holding baby close with eye contact during feedings. Reviewed drying up mom's milk using cold and cabbage leaves, no warmth or stimulation and wearing a well fitting supportive bra.  Mom reports the cabbage leaves working the best with her last to dry up her milk.  Discussed differences, prevention, treatment and when to seek further help for full breasts, plugged ducts, engorgement and mastitis.  Mom has no further questions at this time.  Encouraged mom to call number on white board with any questions, concerns or assistance. Maternal Data    Feeding Nipple Type: Slow - flow  LATCH Score                    Lactation Tools Discussed/Used    Interventions    Discharge    Consult Status      Louis Meckel 05/22/2020, 6:48 PM

## 2020-05-22 NOTE — Anesthesia Procedure Notes (Signed)
Epidural Patient location during procedure: OB  Staffing Anesthesiologist: Piscitello, Cleda Mccreedy, MD Performed: anesthesiologist   Preanesthetic Checklist Completed: patient identified, IV checked, site marked, risks and benefits discussed, surgical consent, monitors and equipment checked, pre-op evaluation and timeout performed  Epidural Patient position: sitting Prep: Betadine and ChloraPrep Patient monitoring: heart rate, continuous pulse ox and blood pressure Approach: midline Location: L4-L5 Injection technique: LOR saline  Needle:  Needle type: Tuohy  Needle gauge: 17 G Needle length: 9 cm and 9 Needle insertion depth: 6 cm Catheter type: closed end flexible Catheter size: 19 Gauge Catheter at skin depth: 12 cm Test dose: negative and 1.5% lidocaine with Epi 1:200 K  Assessment Sensory level: T10 Events: blood not aspirated, injection not painful, no injection resistance, no paresthesia and negative IV test  Additional Notes   Patient tolerated the insertion well without complications.-SATD -IVTD. No paresthesia. Refer to [facility] nursing for VS and dosingReason for block:procedure for pain

## 2020-05-22 NOTE — Anesthesia Preprocedure Evaluation (Signed)
Anesthesia Evaluation  Patient identified by MRN, date of birth, ID band Patient awake    Reviewed: Allergy & Precautions, H&P , NPO status , Patient's Chart, lab work & pertinent test results, reviewed documented beta blocker date and time   Airway Mallampati: II  TM Distance: >3 FB Neck ROM: full    Dental no notable dental hx. (+) Teeth Intact   Pulmonary neg pulmonary ROS, Current Smoker, former smoker,    Pulmonary exam normal breath sounds clear to auscultation       Cardiovascular Exercise Tolerance: Good negative cardio ROS   Rhythm:regular Rate:Normal     Neuro/Psych negative neurological ROS  negative psych ROS   GI/Hepatic negative GI ROS, Neg liver ROS,   Endo/Other  negative endocrine ROSdiabetes, Gestational  Renal/GU      Musculoskeletal   Abdominal   Peds  Hematology negative hematology ROS (+)   Anesthesia Other Findings   Reproductive/Obstetrics (+) Pregnancy                             Anesthesia Physical Anesthesia Plan  ASA: II  Anesthesia Plan: Epidural   Post-op Pain Management:    Induction:   PONV Risk Score and Plan:   Airway Management Planned:   Additional Equipment:   Intra-op Plan:   Post-operative Plan:   Informed Consent: I have reviewed the patients History and Physical, chart, labs and discussed the procedure including the risks, benefits and alternatives for the proposed anesthesia with the patient or authorized representative who has indicated his/her understanding and acceptance.       Plan Discussed with:   Anesthesia Plan Comments:         Anesthesia Quick Evaluation

## 2020-05-23 LAB — CBC
HCT: 30.5 % — ABNORMAL LOW (ref 36.0–46.0)
Hemoglobin: 10.3 g/dL — ABNORMAL LOW (ref 12.0–15.0)
MCH: 30.7 pg (ref 26.0–34.0)
MCHC: 33.8 g/dL (ref 30.0–36.0)
MCV: 91 fL (ref 80.0–100.0)
Platelets: 164 10*3/uL (ref 150–400)
RBC: 3.35 MIL/uL — ABNORMAL LOW (ref 3.87–5.11)
RDW: 13 % (ref 11.5–15.5)
WBC: 13.5 10*3/uL — ABNORMAL HIGH (ref 4.0–10.5)
nRBC: 0 % (ref 0.0–0.2)

## 2020-05-23 LAB — RPR: RPR Ser Ql: NONREACTIVE

## 2020-05-23 MED ORDER — IBUPROFEN 600 MG PO TABS: 600.0000 mg | ORAL_TABLET | Freq: Four times a day (QID) | ORAL | 0 refills | Status: AC | PRN
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
## 2020-05-23 NOTE — Anesthesia Postprocedure Evaluation (Signed)
Anesthesia Post Note  Patient: Kelly Burch  Procedure(s) Performed: AN AD HOC LABOR EPIDURAL  Patient location during evaluation: Mother Baby Anesthesia Type: Epidural Level of consciousness: awake and alert Pain management: pain level controlled Vital Signs Assessment: post-procedure vital signs reviewed and stable Respiratory status: spontaneous breathing, nonlabored ventilation and respiratory function stable Cardiovascular status: stable Postop Assessment: no headache, no backache and epidural receding Anesthetic complications: no   No complications documented.   Last Vitals:  Vitals:   05/23/20 0311 05/23/20 0801  BP: 115/73 115/74  Pulse: 84 77  Resp: 18 18  Temp: 36.8 C 36.7 C  SpO2: 96% 100%    Last Pain:  Vitals:   05/23/20 0801  TempSrc: Oral  PainSc:                  Yevette Edwards

## 2020-05-23 NOTE — Progress Notes (Signed)
Pt discharged with infant. Discharge instructions, prescriptions, and follow up appointments given to and reviewed with patient. Pt verbalized understanding. To be escorted out by auxillary.  °

## 2020-05-23 NOTE — Discharge Instructions (Signed)
Postpartum Care After Vaginal Delivery The following information offers guidance about how to care for yourself from the time you deliver your baby to 6-12 weeks after delivery (postpartum period). If you have problems or questions, contact your health care provider for more specific instructions. Follow these instructions at home: Vaginal bleeding  It is normal to have vaginal bleeding (lochia) after delivery. Wear a sanitary pad for bleeding and discharge. ? During the first week after delivery, the amount and appearance of lochia is often similar to a menstrual period. ? Over the next few weeks, it will gradually decrease to a dry, yellow-brown discharge. ? For most women, lochia stops completely by 4-6 weeks after delivery, but can vary.  Change your sanitary pads frequently. Watch for any changes in your flow, such as: ? A sudden increase in volume. ? A change in color. ? Large blood clots.  If you pass a blood clot from your vagina, save it and call your health care provider. Do not flush blood clots down the toilet before talking with your health care provider.  Do not use tampons or douches until your health care provider approves.  If you are not breastfeeding, your period should return 6-8 weeks after delivery. If you are feeding your baby breast milk only, your period may not return until you stop breastfeeding. Perineal care  Keep the area between the vagina and the anus (perineum) clean and dry. Use medicated pads and pain-relieving sprays and creams as directed.  If you had a surgical cut in the perineum (episiotomy) or a tear, check the area for signs of infection until you are healed. Check for: ? More redness, swelling, or pain. ? Fluid or blood coming from the cut or tear. ? Warmth. ? Pus or a bad smell.  You may be given a squirt bottle to use instead of wiping to clean the perineum area after you use the bathroom. Pat the area gently to dry it.  To relieve pain  caused by an episiotomy, a tear, or swollen veins in the anus (hemorrhoids), take a warm sitz bath 2-3 times a day. In a sitz bath, the warm water should only come up to your hips and cover your buttocks.   Breast care  In the first few days after delivery, your breasts may feel heavy, full, and uncomfortable (breast engorgement). Milk may also leak from your breasts. Ask your health care provider about ways to help relieve the discomfort.  If you are breastfeeding: ? Wear a bra that supports your breasts and fits well. Use breast pads to absorb milk that leaks. ? Keep your nipples clean and dry. Apply creams and ointments as told. ? You may have uterine contractions every time you breastfeed for up to several weeks after delivery. This helps your uterus return to its normal size. ? If you have any problems with breastfeeding, notify your health care provider or lactation consultant.  If you are not breastfeeding: ? Avoid touching your breasts. Do not squeeze out (express) milk. Doing this can make your breasts produce more milk. ? Wear a good-fitting bra and use cold packs to help with swelling. Intimacy and sexuality  Ask your health care provider when you can engage in sexual activity. This may depend upon: ? Your risk of infection. ? How fast you are healing. ? Your comfort and desire to engage in sexual activity.  You are able to get pregnant after delivery, even if you have not had your period. Talk with   your health care provider about methods of birth control (contraception) or family planning if you desire future pregnancies. Medicines  Take over-the-counter and prescription medicines only as told by your health care provider.  Take an over-the-counter stool softener to help ease bowel movements as told by your health care provider.  If you were prescribed an antibiotic medicine, take it as told by your health care provider. Do not stop taking the antibiotic even if you start to  feel better.  Review all previous and current prescriptions to check for possible transfer into breast milk. Activity  Gradually return to your normal activities as told by your health care provider.  Rest as much as possible. Nap while your baby is sleeping. Eating and drinking  Drink enough fluid to keep your urine pale yellow.  To help prevent or relieve constipation, eat high-fiber foods every day.  Choose healthy eating to support breastfeeding or weight loss goals.  Take your prenatal vitamins until your health care provider tells you to stop.   General tips/recommendations  Do not use any products that contain nicotine or tobacco. These products include cigarettes, chewing tobacco, and vaping devices, such as e-cigarettes. If you need help quitting, ask your health care provider.  Do not drink alcohol, especially if you are breastfeeding.  Do not take medications or drugs that are not prescribed to you, especially if you are breastfeeding.  Visit your health care provider for a postpartum checkup within the first 3-6 weeks after delivery.  Complete a comprehensive postpartum visit no later than 12 weeks after delivery.  Keep all follow-up visits for you and your baby. Contact a health care provider if:  You feel unusually sad or worried.  Your breasts become red, painful, or hard.  You have a fever or other signs of an infection.  You have bleeding that is soaking through one pad an hour or you have blood clots.  You have a severe headache that doesn't go away or you have vision changes.  You have nausea and vomiting and are unable to eat or drink anything for 24 hours. Get help right away if:  You have chest pain or difficulty breathing.  You have sudden, severe leg pain.  You faint or have a seizure.  You have thoughts about hurting yourself or your baby. If you ever feel like you may hurt yourself or others, or have thoughts about taking your own life,  get help right away. Go to your nearest emergency department or:  Call your local emergency services (911 in the [place]  The National Suicide Prevention Lifeline at 1-800-273-8255. This suicide crisis helpline is open 24 hours a day.  Text the Crisis Text Line at 741741 (in the [place] Summary  The period of time after you deliver your newborn up to 6-12 weeks after delivery is called the postpartum period.  Keep all follow-up visits for you and your baby.  Review all previous and current prescriptions to check for possible transfer into breast milk.  Contact a health care provider if you feel unusually sad or worried during the postpartum period. This information is not intended to replace advice given to you by your health care provider. Make sure you discuss any questions you have with your health care provider. Document Revised: 09/11/2019 Document Reviewed: 09/11/2019 Elsevier Patient Education  2021 [employer] Postpartum Baby Blues The postpartum period begins right after the birth of a baby. During this time, there is often joy and excitement. It is also a   time of many changes in the life of the parents. A mother may feel happy one minute and sad or stressed the next. These feelings of sadness, called the baby blues, usually happen in the period right after the baby is born and go away within a week or two. What are the causes? The exact cause of this condition is not known. Changes in hormone levels after childbirth are believed to trigger some of the symptoms. Other factors that can play a role in these mood changes include:  Lack of sleep.  Stressful life events, such as financial problems, caring for a loved one, or death of a loved one.  Genetics. What are the signs or symptoms? Symptoms of this condition include:  Changes in mood, such as going from extreme happiness to sadness.  A decrease in concentration.  Difficulty sleeping.  Crying spells and  tearfulness.  Loss of appetite.  Irritability.  Anxiety. If these symptoms last for more than 2 weeks or become more severe, you may have postpartum depression. How is this diagnosed? This condition is diagnosed based on an evaluation of your symptoms. Your health care provider may use a screening tool that includes a list of questions to help identify a person with the baby blues or postpartum depression. How is this treated? The baby blues usually go away on their own in 1-2 weeks. Social support is often what is needed. You will be encouraged to get adequate sleep and rest. Follow these instructions at home: Lifestyle  Get as much rest as you can. Take a nap when the baby sleeps.  Exercise regularly as told by your health care provider. Some women find yoga and walking to be helpful.  Eat a balanced and nourishing diet. This includes plenty of fruits and vegetables, whole grains, and lean proteins.  Do little things that you enjoy. Take a bubble bath, read your favorite magazine, or listen to your favorite music.  Avoid alcohol.  Ask for help with household chores, cooking, grocery shopping, or running errands. Do not try to do everything yourself. Consider hiring a postpartum doula to help. This is a professional who specializes in providing support to new mothers.  Try not to make any major life changes during pregnancy or right after giving birth. This can add stress.      General instructions  Talk to people close to you about how you are feeling. Get support from your partner, family members, friends, or other new moms. You may want to join a support group.  Find ways to manage stress. This may include: ? Writing your thoughts and feelings in a journal. ? Spending time outside. ? Spending time with people who make you laugh.  Try to stay positive in how you think. Think about the things you are grateful for.  Take over-the-counter and prescription medicines only as  told by your health care provider.  Let your health care provider know if you have any concerns.  Keep all postpartum visits. This is important. Contact a health care provider if:  Your baby blues do not go away after 2 weeks. Get help right away if:  You have thoughts of taking your own life (suicidal thoughts), or of harming your baby or someone else.  You see or hear things that are not there (hallucinations). If you ever feel like you may hurt yourself or others, or have thoughts about taking your own life, get help right away. Go to your nearest emergency department or:  Call   your local emergency services (911 in the [place]  Call a suicide crisis helpline, such as the National Suicide Prevention Lifeline, at (313)047-4622. This is open 24 hours a day in the [place]  Text the Crisis Text Line at 6693656704 (in the [place] Summary  After giving birth, you may feel happy one minute and sad or stressed the next. Feelings of sadness that happen right after the baby is born and go away after a week or two are called the baby blues.  You can manage the baby blues by getting enough rest, eating a healthy diet, exercising, spending time with supportive people, and finding ways to manage stress.  If feelings of sadness and stress last longer than 2 weeks or get in the way of caring for your baby, talk with your health care provider. This may mean you have postpartum depression. This information is not intended to replace advice given to you by your health care provider. Make sure you discuss any questions you have with your health care provider. Document Revised: 06/20/2019 Document Reviewed: 06/20/2019 Elsevier Patient Education  2021 [employer] Storing Breast Milk Breast milk has infection-fighting cells (antibodies) in it. Breast milk is a fresh, living food that can go bad if not stored properly. Expressed breast milk, by pumping or by hand, needs to be stored in a certain way so that it  remains effective and safe in helping to protect your baby against infections and to help them grow. The following guidelines are for storing breast milk for a healthy, full-term infant. It is important to wash your hands with soap and water for at least 20 seconds before and after expressing and storing breast milk. If soap and water are not available, use hand sanitizer. What are the risks? There are no risks in storing breastmilk if done according to the guidelines in this document. However, frozen milk, once thawed, can smell different from fresh milk. This does not mean that the milk is bad and cannot be fed to the baby. Many babies will accept this milk just fine. Contact a health care provider or lactation specialist if you notice that your thawed breastmilk smells different and your baby refuses to drink it. How to store breast milk  Milk may be stored in: ? A glass container. ? A hard, BPA-free plastic container. ? A plastic bag designed for storing breast milk. Many women like using plastic bags because they take up less space than other containers and can be attached directly to the breast pump.  Store your milk in 2-4 oz (60-120 mL) servings. This makes it easier to thaw the milk. It also helps you to avoid having to throw out milk that your baby does not drink.  Leave an inch or so (about 2.5 cm) at the top of the bag or bottle so the milk has room to expand as it freezes.  Label each container with the date and time the milk was expressed. This will help you to use the milk in the order that it was expressed. Use the "first in, first out" method when using stored milk.  If you will be freezing your breast milk, freeze it within 24 hours of expressing. Store it in the back of the freezer to prevent the milk from being affected by temperature changes when the freezer door is opened. You do not have to freeze all of your pumped milk. It is best to use the milk fresh, while being stored  in the refrigerator for  up to 4 days.   How long can breast milk be stored?  The amount of time within which expressed breastmilk can be used safely depends on how it is stored.  Milk can be stored for up to 4 hours at room temperature, or 60-85F (15.6-19.4C). However, it is acceptable to allow the milk to sit for 6-8 hours at lower temperatures if the pump parts and containers are well cleaned and you have washed your hands well before expressing breast milk.  Milk can be stored for up to 4 days in a refrigerator at less than 39F (3.9C). However, it is acceptable to allow the milk to be refrigerated for up to 5-8 days if the pump parts and containers are well cleaned. It is best to store milk towards the back of the refrigerator. If you are storing milk at work, it is OK to store it in a shared refrigerator.  Milk can be stored for 2 weeks in a freezer compartment inside a refrigerator.  Milk can be stored for up to 6 months in a freezer compartment of a refrigerator with a separate door.  Milk can be stored for 6-12 months in a deep freezer at -4F (-20C). A deep freezer is a chest or stand-alone freezer that is not opened very often and is kept at a colder temperature than a regular freezer.  Milk left over from a feeding should be used within 2 hours. After 2 hours, throw away any leftover milk in the bottle. How to thaw frozen breast milk  Frozen milk can be thawed: ? In a refrigerator. ? Under warm, running tap water for up to 15 minutes. ? In a pan of warm water that has been heated on the stove. Let it sit for up to 15 minutes. Place the container or bag in the pan of water.  Do not heat milk directly on the stove or in the microwave. The milk will heat unevenly, and it may burn the baby's mouth. It will also destroy some of the milk's infection-fighting properties. Can I refreeze thawed milk? Do not refreeze thawed milk. You can store thawed milk in the refrigerator for up to  24 hours. Can I add freshly pumped milk to frozen milk? If you want to freeze freshly expressed milk along with milk that is already frozen, take these steps to prevent the fresh milk from thawing out the frozen milk:  Chill the fresh milk in the refrigerator for 30 minutes before adding it to the frozen milk.  Make sure that the amount of fresh milk you add is less than the amount of frozen milk. Where to find more information  Center for Disease Control: www.cdc.gov  La Leche League: www.llli.org  American Academy of Pediatrics: www.healthychildren.org Summary  Breast milk has infection-fighting cells (antibodies) in it. Expressed breast milk needs to be stored in a certain way.  The amount of time within which expressed breastmilk can be used safely depends on how it is stored.  Thaw breast milk safely either in the refrigerator, in warm water, or under warm running tap water. Do not heat milk directly on the stove or in the microwave.  Do not refreeze thawed breast milk. Use thawed breast milk stored in the refrigerator within 24 hours. This information is not intended to replace advice given to you by your health care provider. Make sure you discuss any questions you have with your health care provider. Document Revised: 06/17/2019 Document Reviewed: 06/17/2019 Elsevier Patient Education  2021   [employer] Breast Pumping Tips Breast pumping is a way to get milk out of your breasts. You will then store the milk for your baby to use when you are away from home. There are three ways to pump.  You can use your hand to massage and squeeze your breast (hand expression).  You can use a hand-held machine to manually pump your milk.  You can use an electric machine to pump your milk. In the beginning you may not get much milk. After a few days, your breasts should make more. Pumping can help you start making milk after your baby is born. Pumping helps you to keep making milk when you  are away from your baby. When should I pump? You can start pumping soon after your baby is born. Follow these tips:  When you are with your baby: ? Pump after you breastfeed. ? Pump from the free breast while you breastfeed.  When you are away from your baby: ? Pump every 2-3 hours for 15 minutes. ? Pump both breasts at the same time if you can.  If your baby drinks formula, pump around the time your baby gets the formula.  If you drank alcohol, wait 2 hours before you pump.  If you are going to have surgery, ask your doctor when you should pump again. How do I get ready to pump? Try to relax. Try these things to help your milk come in:  Smell your baby's blanket or clothes.  Look at a picture or video of your baby.  Sit in a quiet, private space.  Place a cloth on your breast. The cloth should be warm and a little wet.  Massage your breast and nipple.  Play relaxing music.  Picture your milk flowing.  Drink water and eat a snack. What are some tips? General tips for pumping breast milk  Always wash your hands with soap and water for at least 20 seconds before pumping.  If you do not get much milk or if pumping hurts, try different pump settings or a different kind of pump.  Drink enough fluid so your pee (urine) is clear or pale yellow.  Wear clothing that opens in the front or is easy to take off.  Pump milk into a clean bottle or container.  Do not smoke or use any products that contain nicotine or tobacco. If you need help quitting, ask your doctor.  Try to get a hands-free pumping bra, if possible. This makes it easy to pump breast milk. You can buy one or make your own.   Tips for storing breast milk  Store breast milk in a clean, BPA-free container. These include: ? A glass or plastic bottle. ? A milk storage bag.  Store only 2-4 ounces of breast milk in each container.  Swirl the breast milk in the container. Do not shake it.  Write down the date  you pumped the milk on the container.  This is how long you can store breast milk: ? Room temperature: 6-8 hours. It is best to use the milk within 4 hours. ? Cooler with ice packs: 24 hours. ? Refrigerator: 5-8 days, if the milk is clean. It is best to use the milk within 3 days. ? Freezer: 9-12 months, if the milk is clean and stored away from the freezer door. It is best to use the milk within 6 months.  Put milk in the back of the refrigerator or freezer.  Thaw frozen milk using warm  water. Do not use the microwave.   Tips for choosing a breast pump When choosing a pump, keep the following things in mind:  Manual breast pumps do not need electricity. They cost less. They can be hard to use.  Electric breast pumps use electricity. They are more expensive. They are easier to use. They collect more milk.  The suction cup (flange) should be the right size.  Before you buy the pump, check if your insurance will pay for it. Tips for caring for a breast pump  Check the manual that came with your pump for cleaning tips.  Try not to touch the inside of pump parts.  Clean the pump after you use it. To do this: ? Wipe down the electrical part. Use a dry cloth or paper towel. Do not put this part in water or in cleaning products. ? Wash the plastic parts with soap and warm water. Or use the dishwasher if the manual says it is safe. You do not need to clean the tubing unless it touched breast milk. ? Let all the parts air dry. Avoid drying them with a cloth or towel. ? When the parts are clean and dry, put the pump back together. Then store the pump.  If there is water in the tubing when you want to pump: 1. Attach the tubing to the pump. 2. Turn on the pump to dry the tubing. 3. Turn off the pump when the tube is dry. Summary  Pumping can help you start making milk after your baby is born. It lets you keep making milk when you are away from your baby.  When you are away from your  baby, pump for about 15 minutes every 2-3 hours. Pump both breasts at the same time, if you can. This information is not intended to replace advice given to you by your health care provider. Make sure you discuss any questions you have with your health care provider. Document Revised: 10/07/2019 Document Reviewed: 10/07/2019 Elsevier Patient Education  2021 [employer].

## 2020-05-23 NOTE — Progress Notes (Signed)
  Subjective:  She is feeling well, she would like to go home today.  Pain control is adequate. Voiding without difficulty. Tolerating a regular diet. Ambulating well.  Objective:   Blood pressure 115/74, pulse 77, temperature 98 F (36.7 C), temperature source Oral, resp. rate 18, height 5\' 3"  (1.6 m), weight 74.4 kg, last menstrual period 08/24/2019, SpO2 100 %, unknown if currently breastfeeding.  General: NAD Pulmonary: no increased work of breathing Abdomen: non-distended, non-tender Uterus:  fundus firm at U; lochia normal Extremities: no edema, no erythema, no tenderness, no signs of DVT  Results for orders placed or performed during the hospital encounter of 05/22/20 (from the past 72 hour(s))  CBC     Status: Abnormal   Collection Time: 05/22/20  7:46 AM  Result Value Ref Range   WBC 13.4 (H) 4.0 - 10.5 K/uL   RBC 3.85 (L) 3.87 - 5.11 MIL/uL   Hemoglobin 11.7 (L) 12.0 - 15.0 g/dL   HCT 05/24/20 (L) 19.5 - 09.3 %   MCV 90.9 80.0 - 100.0 fL   MCH 30.4 26.0 - 34.0 pg   MCHC 33.4 30.0 - 36.0 g/dL   RDW 26.7 12.4 - 58.0 %   Platelets 196 150 - 400 K/uL   nRBC 0.0 0.0 - 0.2 %    Comment: Performed at [facility], [address] [place], [place]  Type and screen [facility]     Status: None   Collection Time: 05/22/20  7:46 AM  Result Value Ref Range   ABO/RH(D) A POS    Antibody Screen NEG    Sample Expiration      05/25/2020,2359 Performed at [facility], [address] [place], [place]   CBC     Status: Abnormal   Collection Time: 05/23/20  5:38 AM  Result Value Ref Range   WBC 13.5 (H) 4.0 - 10.5 K/uL   RBC 3.35 (L) 3.87 - 5.11 MIL/uL   Hemoglobin 10.3 (L) 12.0 - 15.0 g/dL   HCT 05/25/20 (L) 67.3 - 41.9 %   MCV 91.0 80.0 - 100.0 fL   MCH 30.7 26.0 - 34.0 pg   MCHC 33.8 30.0 - 36.0 g/dL   RDW 37.9 02.4 - 09.7 %   Platelets 164 150 - 400 K/uL   nRBC 0.0 0.0 - 0.2 %    Comment: Performed at [facility], [address] [place], [place]    Assessment:   25 y.o. 22 postpartum day # 1  Plan:   1) Acute blood loss anemia - hemodynamically stable and asymptomatic - po ferrous sulfate  2) Blood Type --/--/A POS (05/14 0746)   3) Rubella 15.30 (10/25 0847) / Varicella Immune  4) TDAP status - up to date Immunization History  Administered Date(s) Administered  . Influenza,inj,Quad PF,6+ Mos 11/06/2012  . Tdap 12/04/2012, 03/16/2020    5) Feeding- bottle   6) Contraception - OCP, will start at 6 weeks postpartum  7) Disposition - Home today  05/16/2020 MD [facility], [facility] 05/23/2020 10:58 AM

## 2020-05-26 ENCOUNTER — Other Ambulatory Visit: Payer: Medicaid Other

## 2020-05-27 ENCOUNTER — Encounter: Admission: RE | Payer: Self-pay | Source: Home / Self Care

## 2020-05-27 ENCOUNTER — Inpatient Hospital Stay
Admission: RE | Admit: 2020-05-27 | Payer: Medicaid Other | Source: Home / Self Care | Admitting: Obstetrics and Gynecology

## 2020-05-27 SURGERY — Surgical Case
Anesthesia: Choice | Laterality: Bilateral

## 2020-05-29 ENCOUNTER — Inpatient Hospital Stay (HOSPITAL_COMMUNITY): Admit: 2020-05-29 | Payer: Self-pay

## 2020-06-10 ENCOUNTER — Encounter: Payer: Self-pay | Admitting: Obstetrics and Gynecology

## 2020-06-10 ENCOUNTER — Other Ambulatory Visit: Payer: Self-pay

## 2020-06-10 ENCOUNTER — Ambulatory Visit (INDEPENDENT_AMBULATORY_CARE_PROVIDER_SITE_OTHER): Payer: Medicaid Other | Admitting: Obstetrics and Gynecology

## 2020-06-10 DIAGNOSIS — Z3041 Encounter for surveillance of contraceptive pills: Secondary | ICD-10-CM

## 2020-06-10 MED ORDER — NORETHIN-ETH ESTRAD TRIPHASIC 0.5/0.75/1-35 MG-MCG PO TABS: 1.0000 | ORAL_TABLET | Freq: Every day | ORAL | 11 refills | Status: DC

## 2020-06-10 NOTE — Progress Notes (Signed)
Postpartum Visit  Chief Complaint:  Chief Complaint  Patient presents with  . Postpartum Care    History of Present Illness: Patient is a 25 y.o. [id number] presents for postpartum visit.  Date of delivery: 05/22/2020 Type of delivery: Vaginal Laceration: 2nd degree  Pregnancy or labor problems: no  Breast Feeding:  no Lochia: none Post partum depression/anxiety noted:  no Edinburgh Post-Partum Depression Score:  2  Date of last PAP: 10/20/2019  normal  Any problems since the delivery:  no  Newborn Details:  SINGLETON :  1. Baby Gender:female.  Infant Status: Infant doing well at home with mother.   Review of Systems: Review of Systems  Constitutional: Negative for chills, fever, malaise/fatigue and weight loss.  HENT: Negative for congestion, hearing loss and sinus pain.   Eyes: Negative for blurred vision and double vision.  Respiratory: Negative for cough, sputum production, shortness of breath and wheezing.   Cardiovascular: Negative for chest pain, palpitations, orthopnea and leg swelling.  Gastrointestinal: Negative for abdominal pain, constipation, diarrhea, nausea and vomiting.  Genitourinary: Negative for dysuria, flank pain, frequency, hematuria and urgency.  Musculoskeletal: Negative for back pain, falls and joint pain.  Skin: Negative for itching and rash.  Neurological: Negative for dizziness and headaches.  Psychiatric/Behavioral: Negative for depression, substance abuse and suicidal ideas. The patient is not nervous/anxious.      Past Medical History:  Past Medical History:  Diagnosis Date  . Medical history non-contributory     Past Surgical History:  Past Surgical History:  Procedure Laterality Date  . EYE SURGERY    . 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 NO PAST SURGERIES    . TONSILLECTOMY      Family History:  Family History  Problem Relation Age of Onset  . Asthma Mother   . Hypertension Father   . Cancer Maternal Grandfather   . Diabetes  Maternal Grandfather   . Cancer Paternal Grandfather   . Diabetes Son 6       type I  . Alcohol abuse Neg Hx   . Arthritis Neg Hx   . Birth defects Neg Hx   . COPD Neg Hx   . Depression Neg Hx   . Early death Neg Hx   . Hearing loss Neg Hx   . Heart disease Neg Hx   . Hyperlipidemia Neg Hx   . Kidney disease Neg Hx   . Learning disabilities Neg Hx   . Mental illness Neg Hx   . Mental retardation Neg Hx   . Miscarriages / Stillbirths Neg Hx   . Stroke Neg Hx   . Vision loss Neg Hx     Social History:  Social History   Socioeconomic History  . Marital status: Single    Spouse name: Not on file  . Number of children: Not on file  . Years of education: Not on file  . Highest education level: Not on file  Occupational History  . Not on file  Tobacco Use  . Smoking status: Former Smoker    Packs/day: 0.50    Years: 2.00    Pack years: 1.00    Types: Cigarettes    Quit date: 2019    Years since quitting: 3.4  . Smokeless tobacco: Never Used  Vaping Use  . Vaping Use: Never used  Substance and Sexual Activity  . Alcohol use: No  . Drug use: No  . Sexual activity: Yes    Birth control/protection: Pill  Other Topics Concern  .  Not on file  Social History Narrative  . Not on file   Social Determinants of Health   Financial Resource Strain: Not on file  Food Insecurity: Not on file  Transportation Needs: Not on file  Physical Activity: Not on file  Stress: Not on file  Social Connections: Not on file  Intimate Partner Violence: Not on file    Allergies:  Allergies  Allergen Reactions  . Iodides Anxiety, Hives, Itching and Palpitations    After injection of IV contrast for CT, pt began having palpitations, tachycardia, would not speak, after rapid response team arrived rash and hives with itching began.  Transferred to ED.  . Iodine Anxiety, Itching, Rash and Palpitations    After injection of IV contrast for CT, pt began having palpitations, tachycardia,  would not speak, after rapid response team arrived rash and hives with itching began. Transferred to ED. After injection of IV contrast for CT, pt began having palpitations, tachycardia, would not speak, after rapid response team arrived rash and hives with itching began. Transferred to ED. After injection of IV contrast for CT, pt began having palpitations, tachycardia, would not speak, after rapid response team arrived rash and hives with itching began. Transferred to ED.    Medications: Prior to Admission medications   Medication Sig Start Date End Date Taking? Authorizing Provider  norethindrone-ethinyl estradiol (CYCLAFEM) 0.5/0.75/1-35 MG-MCG tablet Take 1 tablet by mouth daily. Start when you are at least 6 weeks postpartum 06/10/20  Yes Talley Kreiser R, MD  butalbital-acetaminophen-caffeine (FIORICET) 50-325-40 MG tablet Take 1-2 tablets by mouth every 6 (six) hours as needed for headache. Patient not taking: Reported on 05/22/2020 05/12/20 05/12/21  Natale Milch, MD  ibuprofen (ADVIL) 600 MG tablet Take 1 tablet (600 mg total) by mouth every 6 (six) hours as needed. 05/23/20   Zhaniya Swallows, Jaquelyn Bitter, MD  Prenatal Vit-Fe Fumarate-FA (PRENATAL MULTIVITAMIN) TABS tablet Take 1 tablet by mouth at bedtime.    [provider]    Physical Exam Vitals:  Vitals:   06/10/20 1016  BP: 112/70    Physical Exam Constitutional:      Appearance: Normal appearance. She is well-developed.  HENT:     Head: Normocephalic and atraumatic.  Eyes:     Extraocular Movements: Extraocular movements intact.     Pupils: Pupils are equal, round, and reactive to light.  Neck:     Thyroid: No thyromegaly.  Cardiovascular:     Rate and Rhythm: Normal rate and regular rhythm.     Heart sounds: Normal heart sounds.  Pulmonary:     Effort: Pulmonary effort is normal.     Breath sounds: Normal breath sounds.  Abdominal:     General: Bowel sounds are normal. There is no distension.      Palpations: Abdomen is soft. There is no mass.  Musculoskeletal:     Cervical back: Neck supple.  Neurological:     Mental Status: She is alert and oriented to person, place, and time.  Skin:    General: Skin is warm and dry.  Psychiatric:        Behavior: Behavior normal.        Thought Content: Thought content normal.        Judgment: Judgment normal.  Vitals reviewed.     Assessment: 25 y.o. [id number] presenting for 6 week postpartum visit  Plan: Problem List Items Addressed This Visit   None   Visit Diagnoses    Encounter for birth control pills maintenance    -  Primary   Relevant Medications   norethindrone-ethinyl estradiol (CYCLAFEM) 0.5/0.75/1-35 MG-MCG tablet       1) Contraception-  Desires OCP, will start after 6 weeks postpartum, rx sent today  2)  Pap: up to date  3) Patient underwent screening for postpartum depression with no concerns noted.    - Follow up 4 weeks for postpartum visit   Adelene Idler MD, [facility], [facility] 06/10/2020 10:46 AM

## 2020-07-09 ENCOUNTER — Ambulatory Visit: Payer: Medicaid Other | Admitting: Obstetrics and Gynecology

## 2020-07-27 ENCOUNTER — Ambulatory Visit: Payer: Medicaid Other | Admitting: Obstetrics and Gynecology

## 2020-08-11 ENCOUNTER — Ambulatory Visit: Payer: Medicaid Other | Admitting: Obstetrics and Gynecology

## 2021-05-30 ENCOUNTER — Telehealth: Payer: Self-pay

## 2021-05-30 ENCOUNTER — Other Ambulatory Visit: Payer: Self-pay

## 2021-05-30 DIAGNOSIS — Z3041 Encounter for surveillance of contraceptive pills: Secondary | ICD-10-CM

## 2021-05-30 MED ORDER — NORETHIN-ETH ESTRAD TRIPHASIC 0.5/0.75/1-35 MG-MCG PO TABS: 1.0000 | ORAL_TABLET | Freq: Every day | ORAL | 0 refills | Status: AC

## 2021-05-30 NOTE — Telephone Encounter (Signed)
Called Kelly Burch told her she was due for her annual and I had sent a message to on the on call provider to see if we can renew her prescription since she was seeing Kelly Burch. I did transfer up front to get her scheduled as well for her annual.  Scheduled for May 30th with Kelly Burch

## 2021-05-30 NOTE — Telephone Encounter (Signed)
Santa called in needing her birth control refilled. Last seen Dr. Gilman Schmidt in 06/2020.  Can we refill her and i'll call her to let her know she's due for an annual.

## 2021-06-07 ENCOUNTER — Ambulatory Visit: Payer: Medicaid Other | Admitting: Obstetrics

## 2022-09-03 IMAGING — DX DG CHEST 1V PORT
1 series · 1 of 1 positions shown · non-contrast
Comparison: None.

CLINICAL DATA: Chest tightness

EXAM:
PORTABLE CHEST 1 VIEW

[chest ap]
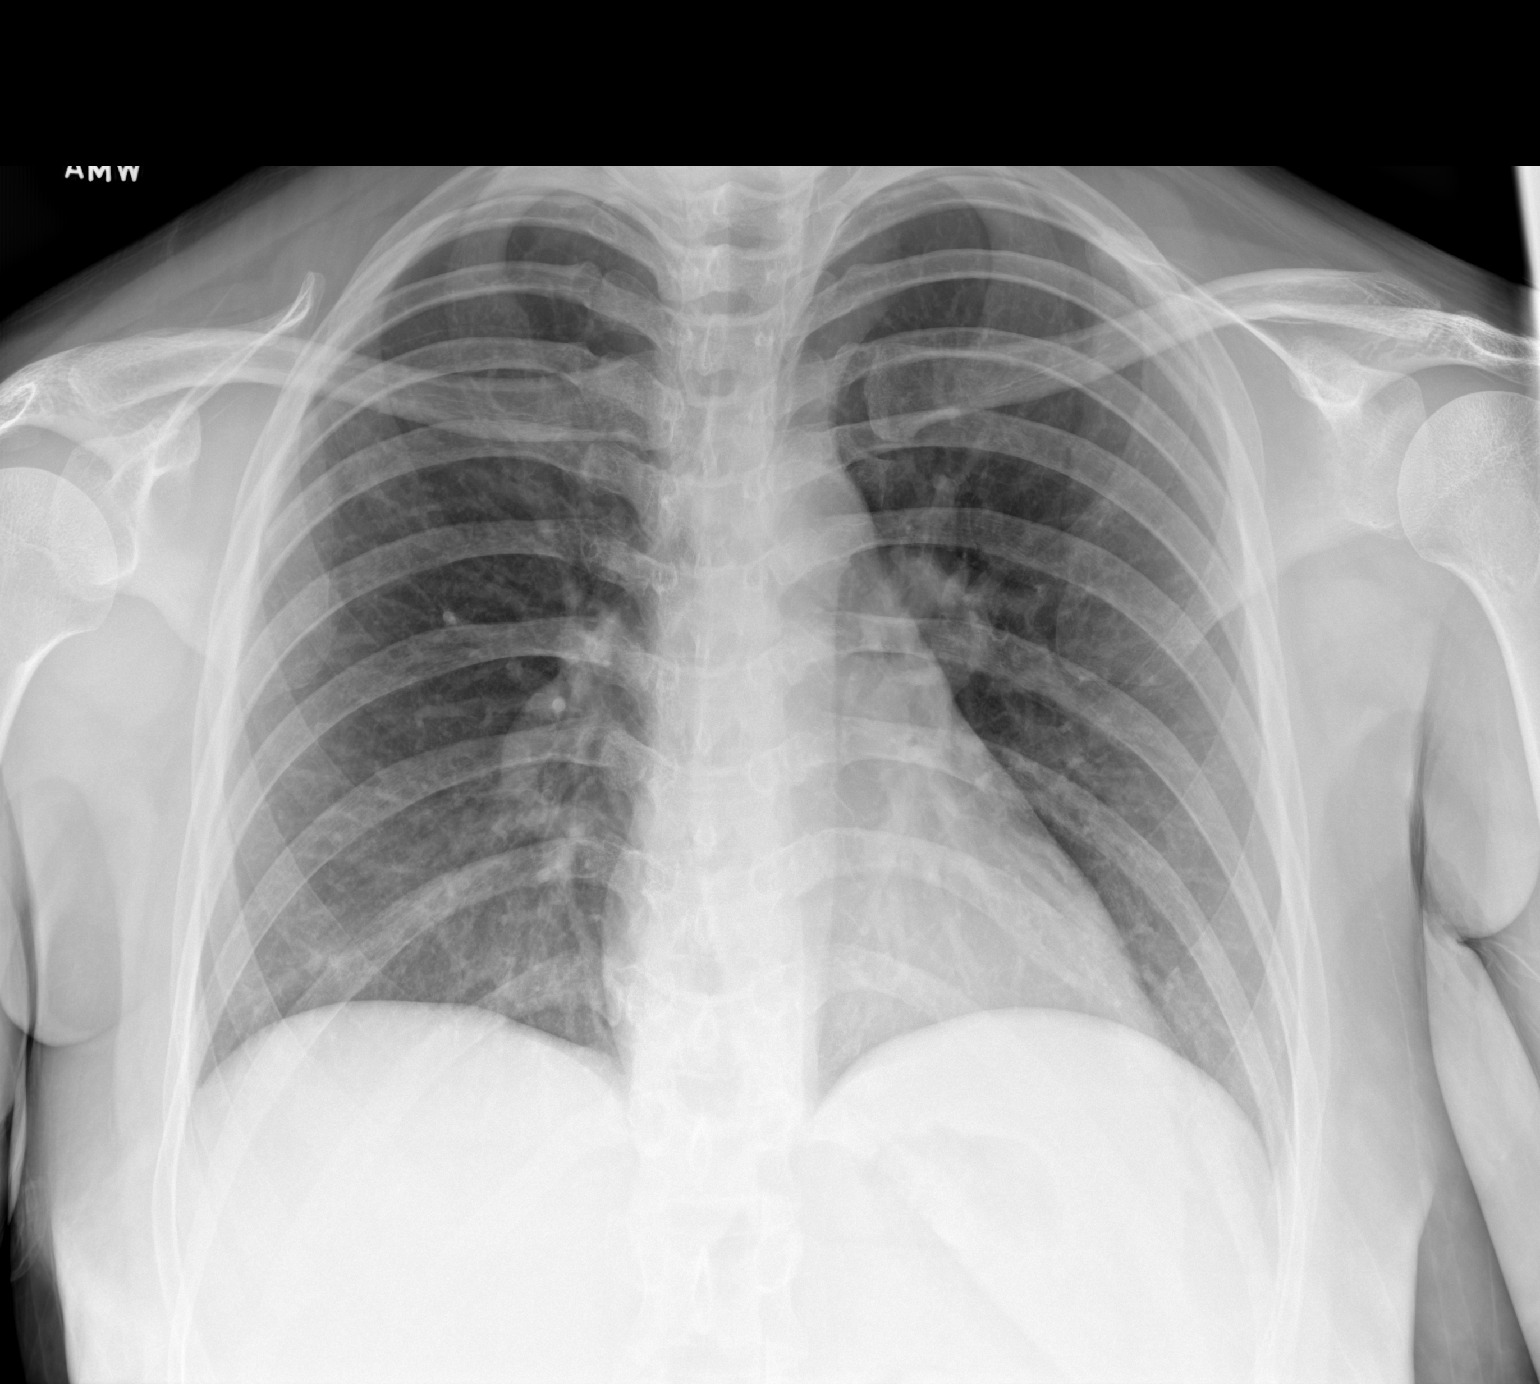

[1 of 1 positions shown; findings below may reference images not displayed]

FINDINGS: The heart size and mediastinal contours are within normal limits.
Both lungs are clear. The visualized skeletal structures are
unremarkable.
IMPRESSION: No active disease.

## 2023-09-26 ENCOUNTER — Other Ambulatory Visit: Payer: Self-pay | Admitting: Medical Genetics

## 2023-10-10 ENCOUNTER — Other Ambulatory Visit
